# Patient Record
Sex: Male | Born: 1945 | Race: Black or African American | Hispanic: No | Smoking: Former smoker
Health system: Southern US, Community
[De-identification: ages and names within clinical notes are randomized; demographics above are authoritative.]

## PROBLEM LIST (undated history)

## (undated) DIAGNOSIS — E079 Disorder of thyroid, unspecified: Secondary | ICD-10-CM

## (undated) DIAGNOSIS — D649 Anemia, unspecified: Secondary | ICD-10-CM

## (undated) DIAGNOSIS — E039 Hypothyroidism, unspecified: Secondary | ICD-10-CM

## (undated) DIAGNOSIS — M1990 Unspecified osteoarthritis, unspecified site: Secondary | ICD-10-CM

## (undated) DIAGNOSIS — N189 Chronic kidney disease, unspecified: Secondary | ICD-10-CM

## (undated) DIAGNOSIS — R7303 Prediabetes: Secondary | ICD-10-CM

## (undated) DIAGNOSIS — I10 Essential (primary) hypertension: Secondary | ICD-10-CM

## (undated) HISTORY — DX: Unspecified osteoarthritis, unspecified site: M19.90

## (undated) HISTORY — DX: Anemia, unspecified: D64.9

## (undated) HISTORY — PX: KNEE SURGERY: SHX244

## (undated) HISTORY — DX: Hypothyroidism, unspecified: E03.9

## (undated) HISTORY — DX: Disorder of thyroid, unspecified: E07.9

## (undated) HISTORY — DX: Essential (primary) hypertension: I10

---

## 2020-11-09 DIAGNOSIS — C189 Malignant neoplasm of colon, unspecified: Secondary | ICD-10-CM

## 2020-11-09 HISTORY — PX: COLONOSCOPY: SHX174

## 2020-11-09 HISTORY — DX: Malignant neoplasm of colon, unspecified: C18.9

## 2020-12-03 ENCOUNTER — Other Ambulatory Visit (HOSPITAL_COMMUNITY): Payer: Self-pay | Admitting: Surgery

## 2020-12-03 ENCOUNTER — Other Ambulatory Visit: Payer: Self-pay | Admitting: Surgery

## 2020-12-03 DIAGNOSIS — C189 Malignant neoplasm of colon, unspecified: Secondary | ICD-10-CM

## 2020-12-04 ENCOUNTER — Telehealth: Payer: Self-pay | Admitting: Gastroenterology

## 2020-12-04 NOTE — Telephone Encounter (Signed)
Hi Dr. Rush Landmark., we have received an urgent referral from [facility] for advanced endoscopic maneuvers for polypectomy. Records will be sent to you for review. Please advise on scheduling. Thank you.

## 2020-12-04 NOTE — Telephone Encounter (Signed)
Thanks for letting me know.  Richard & Richard Silva, This is the patient that we had previously made the following discussion about:   Mansouraty, Telford Nab., MD  Ileana Roup, MD; Melanie Crazier, RN; Mansouraty, Telford Nab., MD CW,  I see that he has not had a MRN made as of yet.  If you can forward the referral I have put our team on this so that they can be on the look out for this patient.  Happy to be of assistance if I can, availability is likely going to be end of March or early April based on how overbooked I have been but we can see if something shows up earlier so that we can try and keep things moving if possible.  Richard and [name], as soon as you see this referral please work on getting the colonoscopy report from [place] and get him set up for a clinic visit with me because we may need to try to do a colon EMR next available to see if we can try and prevent him from needing a larger colon resection if possible.  Thanks for the referral.  GM         Previous Messages    ----- Message -----  From: Ileana Roup, MD  Sent: 12/02/2020   2:25 PM EST  To: [name]., MD  Subject: Urgent referral                                 [name] - there is a patient I was sending y'alls way... He is not in the [facility] system yet as he is from [place]. His name is Richard Silva [birth year] - 81yoM (healthy, currently still working as a [employer]ar[employer] manager!) 1 yr hx of IDA on iron therapy, last scope was 16 yrs ago. Ultimately underwent scope in [place] that showed polypoid lesion presumably in the distal ascending colon that was biopsied and adenomatous as well as multiple '2-8 mm' polyps in the 'ileocecal region.' Asc polyp was tattoo'd. Also found to have mass presumably in splenic flexure at 60-65 cm from the verge that returned adenoCA, tattoo'd. Staging CT A/P didn't clearly localize either finding - suggested some thickening in the mid  transverse colon. We have ordered a CT chest to complete the staging workup. Assuming negative, he could be looking at a subtotal colectomy if we aren't able to clear his polyps proximally including the ascending colon polyp. Would you be willing to take a look and see if candidate for an advanced type polypectomy? I appreciate your help!   Richard Silva   Go ahead and get him on the books for a Colon with EMR slot tentative date. I would get him into clinic and let me have an opportunity to talk with him about things.  I need the Colonoscopy report and pathology to help guide me, but hopefully will be in packet otherwise will need to get from [place], [place]. Please let Dr. Dema Severin and I know when he is scheduled for clinic (OK to [facility] or use a held slot if necessary). Thanks.  GM

## 2020-12-07 ENCOUNTER — Encounter: Payer: Self-pay | Admitting: Gastroenterology

## 2020-12-07 NOTE — Telephone Encounter (Signed)
Richard Silva can you set the is pt up for a clinic visit with Dr Richard Silva.  Also, can you tell the pt we need his colon report and path from [facility] sent for review.  Thank you see the message below from Dr Thurmon Fair and Richard Silva, as soon as you see this referral please work on getting the colonoscopy report from [facility] and get him set up for a clinic visit with me because we may need to try to do a colon EMR next available to see if we can try and prevent him from needing a larger colon resection if possible.  Thanks for the referral.  GM

## 2020-12-08 ENCOUNTER — Other Ambulatory Visit: Payer: Self-pay

## 2020-12-08 ENCOUNTER — Ambulatory Visit (HOSPITAL_COMMUNITY)
Admission: RE | Admit: 2020-12-08 | Discharge: 2020-12-08 | Disposition: A | Discharge status: Home / Self Care | Payer: Federal, State, Local not specified - PPO | Source: Ambulatory Visit | Attending: Surgery | Admitting: Surgery

## 2020-12-08 DIAGNOSIS — C189 Malignant neoplasm of colon, unspecified: Secondary | ICD-10-CM | POA: Diagnosis not present

## 2020-12-08 LAB — POCT I-STAT CREATININE: Creatinine, Ser: 1.1 mg/dL (ref 0.61–1.24)

## 2020-12-08 MED ORDER — IOHEXOL 300 MG/ML  SOLN
75.0000 mL | Freq: Once | INTRAMUSCULAR | Status: AC | PRN
  Administered 2020-12-08: 75 mL via INTRAVENOUS

## 2020-12-08 NOTE — Telephone Encounter (Signed)
Dr. Rush Landmark, Patient's appointment has been moved forward to 12/16/20@ 9:30am.

## 2020-12-09 NOTE — Telephone Encounter (Signed)
Thank you for update and getting him set up for sooner clinic visit. GM

## 2020-12-16 ENCOUNTER — Ambulatory Visit: Payer: Federal, State, Local not specified - PPO | Admitting: Gastroenterology

## 2020-12-16 ENCOUNTER — Other Ambulatory Visit (INDEPENDENT_AMBULATORY_CARE_PROVIDER_SITE_OTHER): Payer: Federal, State, Local not specified - PPO

## 2020-12-16 ENCOUNTER — Other Ambulatory Visit: Payer: Self-pay

## 2020-12-16 ENCOUNTER — Encounter: Payer: Self-pay | Admitting: Gastroenterology

## 2020-12-16 VITALS — BP 120/68 | HR 88 | Ht 66.0 in | Wt 219.0 lb

## 2020-12-16 DIAGNOSIS — C189 Malignant neoplasm of colon, unspecified: Secondary | ICD-10-CM

## 2020-12-16 DIAGNOSIS — D500 Iron deficiency anemia secondary to blood loss (chronic): Secondary | ICD-10-CM

## 2020-12-16 DIAGNOSIS — D122 Benign neoplasm of ascending colon: Secondary | ICD-10-CM

## 2020-12-16 DIAGNOSIS — C185 Malignant neoplasm of splenic flexure: Secondary | ICD-10-CM

## 2020-12-16 DIAGNOSIS — R933 Abnormal findings on diagnostic imaging of other parts of digestive tract: Secondary | ICD-10-CM

## 2020-12-16 DIAGNOSIS — Z86010 Personal history of colonic polyps: Secondary | ICD-10-CM | POA: Diagnosis not present

## 2020-12-16 LAB — BASIC METABOLIC PANEL
BUN: 12 mg/dL (ref 6–23)
CO2: 24 mEq/L (ref 19–32)
Calcium: 9.6 mg/dL (ref 8.4–10.5)
Chloride: 105 mEq/L (ref 96–112)
Creatinine, Ser: 1.14 mg/dL (ref 0.40–1.50)
GFR: 62.18 mL/min (ref >60.00)
Glucose, Bld: 116 mg/dL — ABNORMAL HIGH (ref 70–99)
Potassium: 4.1 mEq/L (ref 3.5–5.1)
Sodium: 135 mEq/L (ref 135–145)

## 2020-12-16 LAB — CBC
HCT: 28.2 % — ABNORMAL LOW (ref 39.0–52.0)
Hemoglobin: 9.3 g/dL — ABNORMAL LOW (ref 13.0–17.0)
MCHC: 32.7 g/dL (ref 30.0–36.0)
MCV: 81.4 fl (ref 78.0–100.0)
Platelets: 601 10*3/uL — ABNORMAL HIGH (ref 150.0–400.0)
RBC: 3.47 Mil/uL — ABNORMAL LOW (ref 4.22–5.81)
RDW: 17.7 % — ABNORMAL HIGH (ref 11.5–15.5)
WBC: 13.2 10*3/uL — ABNORMAL HIGH (ref 4.0–10.5)

## 2020-12-16 LAB — PROTIME-INR
INR: 1.1 ratio — ABNORMAL HIGH (ref 0.8–1.0)
Prothrombin Time: 12.5 s (ref 9.6–13.1)

## 2020-12-16 MED ORDER — PLENVU 140 G PO SOLR: 1.0000 | ORAL | 0 refills | Status: DC

## 2020-12-16 NOTE — Progress Notes (Signed)
[facility] 

## 2020-12-16 NOTE — Patient Instructions (Signed)
Your provider has requested that you go to the basement level for lab work before leaving today. Press "B" on the elevator. The lab is located at the first door on the left as you exit the elevator.  You have been scheduled for a colonoscopy. Please follow written instructions given to you at your visit today.  Please pick up your prep supplies at the pharmacy within the next 1-3 days. If you use inhalers (even only as needed), please bring them with you on the day of your procedure.   We have sent the following medications to your pharmacy for you to pick up at your convenience: Plenvu   START: Miralax -1 capful daily for 1 WEEK prior to colonoscopy.  Due to recent changes in healthcare laws, you may see the results of your imaging and laboratory studies on MyChart before your provider has had a chance to review them.  We understand that in some cases there may be results that are confusing or concerning to you. Not all laboratory results come back in the same time frame and the provider may be waiting for multiple results in order to interpret others.  Please give [place] 48 hours in order for your provider to thoroughly review all the results before contacting the office for clarification of your results.   If you are age 30 or older, your body mass index should be between 23-30. Your Body mass index is 35.35 kg/m. If this is out of the aforementioned range listed, please consider follow up with your Primary Care Provider.   Thank you for choosing me and [facility].  Dr. Rush Landmark

## 2020-12-17 LAB — IBC + FERRITIN
Ferritin: 28.3 ng/mL (ref 22.0–322.0)
Iron: 22 ug/dL — ABNORMAL LOW (ref 42–165)
Saturation Ratios: 6.5 % — ABNORMAL LOW (ref 20.0–50.0)
Transferrin: 241 mg/dL (ref 212.0–360.0)

## 2020-12-21 ENCOUNTER — Telehealth: Payer: Self-pay

## 2020-12-21 DIAGNOSIS — D122 Benign neoplasm of ascending colon: Secondary | ICD-10-CM

## 2020-12-21 DIAGNOSIS — D509 Iron deficiency anemia, unspecified: Secondary | ICD-10-CM

## 2020-12-21 DIAGNOSIS — C189 Malignant neoplasm of colon, unspecified: Secondary | ICD-10-CM

## 2020-12-21 DIAGNOSIS — R933 Abnormal findings on diagnostic imaging of other parts of digestive tract: Secondary | ICD-10-CM

## 2020-12-21 NOTE — Telephone Encounter (Signed)
-----   Message from Richard Silva., MD sent at 12/18/2020  6:14 AM EST ----- To twice dailyRovonda, Please let the patien know that his iron deficiency persists.  He needs to increase his oral iron to twice daily.  Also we will go ahead and add on a quick EGD at the time of his colonoscopy due to the iron deficiency.  He does not need any additional time added for his procedure I just need to get the approval for it which should not be a problem because this is iron deficiency anemia.  Thanks. GM

## 2020-12-21 NOTE — Progress Notes (Signed)
[place] VISIT   Primary Care Provider Vasireddy, Lanetta [place], [place] [address] [phone]  Referring Provider Ileana Roup, MD [address] [place],  [place] [address] [phone]  Patient Profile: Richard Silva is a 75 y.o. male with a pmh significant for hypertension, hypothyroidism, chronic anemia, arthritis, family history colon cancer (mother diagnosed in 9s), recent colon cancer diagnosis (splenic flexure), colon polyps (not removed).  The patient presents to the [facility] for an evaluation and management of problem(s) noted below:  Problem List 1. Adenomatous polyp of ascending colon   2. 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 History of colonic polyps   4. Abnormal colonoscopy   5. Iron deficiency anemia due to chronic blood loss     History of Present Illness This is a patient who recently underwent a colonoscopy (January 2022) by Dr. Earley Brooke in [place] for evaluation of iron deficiency anemia that had been longer standing (for over a year).  He was found to have a splenic flexure adenocarcinoma as well as multiple polyps in his right colon including one polypoid mass that returned as adenomatous tissue.  The patient was evaluated by Dr. Dema Severin colorectal surgery and is considering surgical resection.  The patient would need an extended right hemicolectomy if resection of the ascending colon polypoid mass which returned just as adenoma is unable to be resected.  It is for this reason that the patient is referred to consider advanced resection of the right-sided colon lesions in effort of trying to minimize right hemicolectomy and just undergo segmental colectomy.  The patient is accompanied by his family today.  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  The patient has not noted overt blood in his stools although with the most recent  preparation there was darkness noted in the final prep before undergoing colonoscopy and that was concerning to the patient and family about potentially having blood loss anemia.  The patient would like an attempt at resection if possible through colonoscopy but understands that this may not be possible and is willing to undergo a right hemicolectomy extension if necessary.  He has a family history in his mother of colon cancer diagnosed in her 81s.  He has a bowel movement on a daily to every other day basis and that has been longer standing over the course of the last year.  He denies GERD symptoms or dyspepsia.  Patient has never had a prior upper endoscopy.    GI Review of Systems Positive as above Negative for dysphagia, odynophagia, nausea, vomiting, pain  Review of Systems General: Positive for minor weight loss unintentionally; denies fevers/chills HEENT: Denies oral lesions Cardiovascular: Denies chest pain/palpitations Pulmonary: Denies shortness of breath/cough Gastroenterological: See HPI Genitourinary: Denies darkened urine or hematuria Hematological: Denies easy bruising/bleeding Dermatological: Denies jaundice Psychological: Mood is stable   Medications Current Outpatient Medications  Medication Sig Dispense Refill  . amLODipine (NORVASC) 10 MG tablet Take 10 mg by mouth daily.    Marland Kitchen atorvastatin (LIPITOR) 40 MG tablet Take 40 mg by mouth daily.    . clobetasol cream (TEMOVATE) 4.40 % Apply 1 application topically 2 (two) times daily.    . COPPER PO Take 3 mg by mouth daily.    Marland Kitchen doxycycline (DORYX) 100 MG EC tablet Take 100 mg by mouth 2 (two) times daily.    . ferrous sulfate 325 (65 FE) MG tablet Take 325  mg by mouth daily with breakfast.    . gabapentin (NEURONTIN) 600 MG tablet Take 600 mg by mouth 2 (two) times daily. 2 tablets at bedtime    . levothyroxine (SYNTHROID) 50 MCG tablet Take 50 mcg by mouth daily before breakfast.    . PEG-KCl-NaCl-NaSulf-Na Asc-C  (PLENVU) 140 g SOLR Take 1 kit by mouth as directed. Use coupon: BIN: [id number] PNC: CNRX Group: [id number] ID: [id number] 1 each 0  . spironolactone (ALDACTONE) 25 MG tablet Take 25 mg by mouth daily.    Marland Kitchen triamcinolone (KENALOG) 0.1 % Apply 1 application topically 2 (two) times daily.    . valsartan-hydrochlorothiazide (DIOVAN-HCT) 160-25 MG tablet Take 1 tablet by mouth daily.     No current facility-administered medications for this visit.    Allergies No Known Allergies  Histories Past Medical History:  Diagnosis Date  . Anemia   . Arthritis   . Colon cancer ([place]) 11/09/2020  . Hypertension   . Hypothyroidism   . Thyroid disease    Past Surgical History:  Procedure Laterality Date  . COLONOSCOPY  11/09/2020  . KNEE SURGERY     Social History   Socioeconomic History  . Marital status: Widowed    Spouse name: Not on file  . Number of children: Not on file  . Years of education: Not on file  . Highest education level: Not on file  Occupational History  . Not on file  Tobacco Use  . Smoking status: Never Smoker  . Smokeless tobacco: Not on file  Substance and Sexual Activity  . Alcohol use: Yes    Comment: occasional  . Drug use: Never  . Sexual activity: Not Currently  Other Topics Concern  . Not on file  Social History Narrative  . Not on file   Social Determinants of Health   Financial Resource Strain: Not on file  Food Insecurity: Not on file  Transportation Needs: Not on file  Physical Activity: Not on file  Stress: Not on file  Social Connections: Not on file  Intimate Partner Violence: Not on file   Family History  Problem Relation Age of Onset  . Colon cancer Mother        52's  . Diabetes Brother   . Esophageal cancer Neg Hx   . Inflammatory bowel disease Neg Hx   . Liver disease Neg Hx   . Pancreatic cancer Neg Hx   . Stomach cancer Neg Hx    I have reviewed his medical, social, and family history in detail and updated the electronic  medical record as necessary.    PHYSICAL EXAMINATION  BP 120/68   Pulse 88   Ht _0  (1.676 m)   Wt 219 lb (99.3 kg)   BMI 35.35 kg/m  Wt Readings from Last 3 Encounters:  12/16/20 219 lb (99.3 kg)  GEN: NAD, appears stated age, doesn't appear chronically ill, accompanied by daughter PSYCH: Cooperative, without pressured speech EYE: Conjunctivae pink, sclerae anicteric ENT: MMM CV: Nontachycardic RESP: No audible wheezing GI: NABS, soft, protuberant abdomen, ventral diastases present, NT, without rebound or guarding MSK/EXT: No lower extremity edema SKIN: No jaundice NEURO:  Alert & Oriented x 3, no focal deficits   REVIEW OF DATA  I reviewed the following data at the time of this encounter:  GI Procedures and Studies  January 2022 colonoscopy [facility]) A single sessile nonbleeding polyp of benign appearance ranging in size from 6 to 7 mm with a polyp in the sigmoid colon this is  between 25 and 30 cm from the anus.  A single piece polypectomy was performed using a cold snare. There are multiple polyps around the ileocecal region ranging from 2 to 8 mm in size.  A polypoid mass was seen in the distal ascending colon which was biopsied and the site tattooed with [place] ink.  Multiple biopsies were performed. A malignant looking mass, partially obstructing was seen at 60 to 65 cm.  Multiple cold forceps biopsies were performed for histology.  2 mL Niger ink injection was successfully applied for tattooing. Pathology Ascending colon TVA Colorectal adenocarcinoma splenic flexure mass Adenomatous polyp sigmoid colon  Laboratory Studies  Reviewed those in epic and care everywhere  Imaging Studies  February 2022 CT abdomen pelvis (outside report) mild nonspecific wall thickening along the mid transverse colon could be secondary to under distention or mass.  Correlate with colonoscopy.  4 mm left lower lobe pulmonary nodules.  Patient is at increased risk for lung cancer, consider  follow-up chest CT in 1 year   ASSESSMENT  Mr. Osowski is a 75 y.o. male with a pmh significant for hypertension, hypothyroidism, chronic anemia, arthritis, family history colon cancer (mother), recent colon cancer diagnosis (splenic flexure), colon polyps (not removed).  The patient is seen today for evaluation and management of:  1. Adenomatous polyp of ascending colon   2. 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 History of colonic polyps   4. Abnormal colonoscopy   5. Iron deficiency anemia due to chronic blood loss    The patient is hemodynamically stable.  Clinically the patient is stable as well however, he has a new diagnosis of colon cancer at the presumed splenic flexure versus transverse colon (based on imaging) with evidence of reported iron deficiency anemia.  The patient has already been evaluated by colorectal surgery with plan for surgical intervention as next steps in his treatment algorithm.  However, due to the reported area of the cancer as well as previous polyps that were noted but not removed in the ileocecum and ascending colon regions the potential intervention of an extended right hemicolectomy versus segmental colectomy if advanced resection can be performed of the right-sided lesions has been entertained.  The ascending colon lesion is noted to be a tubulovillous adenoma based on biopsies per the colonoscopy report.  It is reasonable to consider trying to prevent the patient from needing extended right hemicolectomy if resection of the right-sided colon lesions is possible.  Based upon the description (as the endoscopic pictures were black-and-white), I do feel that it is reasonable to pursue an Advanced Polypectomy attempt of the polyp/lesion.  We discussed some of the techniques of advanced polypectomy which include Endoscopic Mucosal Resection, OVESCO Full-Thickness Resection, Endorotor Morcellation, and Tissue Ablation via Fulguration.  We also reviewed images of  typical techniques as noted above.  The risks and benefits of endoscopic evaluation were discussed with the patient; these include but are not limited to the risk of perforation, infection, bleeding, missed lesions, lack of diagnosis, severe illness requiring hospitalization, as well as anesthesia and sedation related illnesses.  During attempts at advanced resection, the risks of bleeding and perforation/leak are increased as opposed to diagnostic and screening procedures, and that was discussed with the patient as well.   In addition, I explained that with the possible need for piecemeal resection, subsequent short-interval endoscopic evaluation for follow up and potential retreatment of the lesion/area may be necessary.  If, after attempt at removal of the polyp/lesion, it is  found that the patient has a complication or that an invasive lesion or malignant lesion is found, or that the polyp/lesion continues to recur, the patient is aware and understands that surgery may still be indicated/required (meaning extended right hemicolectomy).  As the patient has evidence of iron deficiency anemia even though he likely has this is a result of chronic blood loss anemia in the colon, will perform an upper endoscopy for completion of his work-up.  All patient questions were answered, to the best of my ability, and the patient agrees to the aforementioned plan of action with follow-up as indicated.   PLAN  Preprocedure labs to be obtained Iron indices to be obtained and if true iron deficiency persists then oral iron therapy will need to be initiated EGD to be performed for diagnostic purposes and evaluation of iron deficiency Proceed with scheduling colonoscopy with attempt at advanced polyp resection of the right-sided colon lesions to try to prevent extended right hemicolectomy if possible Begin MiraLAX 1 capful daily prior to colonoscopy Follow-up with Dr. Dema Severin of colorectal surgery thereafter   Orders  Placed This Encounter  Procedures  . Procedural/ Surgical Case Request: COLONOSCOPY WITH PROPOFOL, ENDOSCOPIC MUCOSAL RESECTION  . CBC  . Basic Metabolic Panel (BMET)  . INR/PT  . IBC + Ferritin  . Ambulatory referral to Gastroenterology    New Prescriptions   PEG-KCL-NACL-NASULF-NA ASC-C (PLENVU) 140 G SOLR    Take 1 kit by mouth as directed. Use coupon: BIN: [id number] PNC: CNRX Group: [id number] ID: [id number]   Modified Medications   No medications on file    Planned Follow Up No follow-ups on file.   Total Time in Face-to-Face and in Coordination of Care for patient including independent/personal interpretation/review of prior testing, medical history, examination, medication adjustment, communicating results with the patient directly, and documentation with the EHR is 45 minutes.   Justice Britain, MD [facility] Advanced Endoscopy Office # [phone]

## 2020-12-21 NOTE — Telephone Encounter (Signed)
Left voice mail for pt, asking for return call. Pt will need to increase oral iron to BID. EGD has been added to procedures on 01/11/21 @ [facility]. I have also sent Amy [facility] new amb ref with EGD added.

## 2020-12-21 NOTE — H&P (View-Only) (Signed)
 GASTROENTEROLOGY OUTPATIENT CLINIC VISIT   Primary Care Provider Vasireddy, Venugopal Kiran, MD [address] [place] [address] [phone]  Referring Provider White, Christopher M, MD [address] [place],  [place] [address] [phone]  Patient Profile: Richard Silva is a 75 y.o. male with a pmh significant for hypertension, hypothyroidism, chronic anemia, arthritis, family history colon cancer (mother diagnosed in 50s), recent colon cancer diagnosis (splenic flexure), colon polyps (not removed).  The patient presents to the [facility] for an evaluation and management of problem(s) noted below:  Problem List 1. Adenomatous polyp of ascending colon   2. Malignant neoplasm of splenic flexure (HCC)   3. History of colonic polyps   4. Abnormal colonoscopy   5. Iron deficiency anemia due to chronic blood loss     History of Present Illness This is a patient who recently underwent a colonoscopy (January 2022) by Dr. Pandya in [place] for evaluation of iron deficiency anemia that had been longer standing (for over a year).  He was found to have a splenic flexure adenocarcinoma as well as multiple polyps in his right colon including one polypoid mass that returned as adenomatous tissue.  The patient was evaluated by Dr. White colorectal surgery and is considering surgical resection.  The patient would need an extended right hemicolectomy if resection of the ascending colon polypoid mass which returned just as adenoma is unable to be resected.  It is for this reason that the patient is referred to consider advanced resection of the right-sided colon lesions in effort of trying to minimize right hemicolectomy and just undergo segmental colectomy.  The patient is accompanied by his family today.  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  The patient has not noted overt blood in his stools although with the most recent  preparation there was darkness noted in the final prep before undergoing colonoscopy and that was concerning to the patient and family about potentially having blood loss anemia.  The patient would like an attempt at resection if possible through colonoscopy but understands that this may not be possible and is willing to undergo a right hemicolectomy extension if necessary.  He has a family history in his mother of colon cancer diagnosed in her 50s.  He has a bowel movement on a daily to every other day basis and that has been longer standing over the course of the last year.  He denies GERD symptoms or dyspepsia.  Patient has never had a prior upper endoscopy.    GI Review of Systems Positive as above Negative for dysphagia, odynophagia, nausea, vomiting, pain  Review of Systems General: Positive for minor weight loss unintentionally; denies fevers/chills HEENT: Denies oral lesions Cardiovascular: Denies chest pain/palpitations Pulmonary: Denies shortness of breath/cough Gastroenterological: See HPI Genitourinary: Denies darkened urine or hematuria Hematological: Denies easy bruising/bleeding Dermatological: Denies jaundice Psychological: Mood is stable   Medications Current Outpatient Medications  Medication Sig Dispense Refill  . amLODipine (NORVASC) 10 MG tablet Take 10 mg by mouth daily.    . atorvastatin (LIPITOR) 40 MG tablet Take 40 mg by mouth daily.    . clobetasol cream (TEMOVATE) 0.05 % Apply 1 application topically 2 (two) times daily.    . COPPER PO Take 3 mg by mouth daily.    . doxycycline (DORYX) 100 MG EC tablet Take 100 mg by mouth 2 (two) times daily.    . ferrous sulfate 325 (65 FE) MG tablet Take 325   mg by mouth daily with breakfast.    . gabapentin (NEURONTIN) 600 MG tablet Take 600 mg by mouth 2 (two) times daily. 2 tablets at bedtime    . levothyroxine (SYNTHROID) 50 MCG tablet Take 50 mcg by mouth daily before breakfast.    . PEG-KCl-NaCl-NaSulf-Na Asc-C  (PLENVU) 140 g SOLR Take 1 kit by mouth as directed. Use coupon: BIN: [id number] PNC: CNRX Group: [id number] ID: [id number] 1 each 0  . spironolactone (ALDACTONE) 25 MG tablet Take 25 mg by mouth daily.    . triamcinolone (KENALOG) 0.1 % Apply 1 application topically 2 (two) times daily.    . valsartan-hydrochlorothiazide (DIOVAN-HCT) 160-25 MG tablet Take 1 tablet by mouth daily.     No current facility-administered medications for this visit.    Allergies No Known Allergies  Histories Past Medical History:  Diagnosis Date  . Anemia   . Arthritis   . Colon cancer (HCC) 11/09/2020  . Hypertension   . Hypothyroidism   . Thyroid disease    Past Surgical History:  Procedure Laterality Date  . COLONOSCOPY  11/09/2020  . KNEE SURGERY     Social History   Socioeconomic History  . Marital status: Widowed    Spouse name: Not on file  . Number of children: Not on file  . Years of education: Not on file  . Highest education level: Not on file  Occupational History  . Not on file  Tobacco Use  . Smoking status: Never Smoker  . Smokeless tobacco: Not on file  Substance and Sexual Activity  . Alcohol use: Yes    Comment: occasional  . Drug use: Never  . Sexual activity: Not Currently  Other Topics Concern  . Not on file  Social History Narrative  . Not on file   Social Determinants of Health   Financial Resource Strain: Not on file  Food Insecurity: Not on file  Transportation Needs: Not on file  Physical Activity: Not on file  Stress: Not on file  Social Connections: Not on file  Intimate Partner Violence: Not on file   Family History  Problem Relation Age of Onset  . Colon cancer Mother        50's  . Diabetes Brother   . Esophageal cancer Neg Hx   . Inflammatory bowel disease Neg Hx   . Liver disease Neg Hx   . Pancreatic cancer Neg Hx   . Stomach cancer Neg Hx    I have reviewed his medical, social, and family history in detail and updated the electronic  medical record as necessary.    PHYSICAL EXAMINATION  BP 120/68   Pulse 88   Ht 5' 6" (1.676 m)   Wt 219 lb (99.3 kg)   BMI 35.35 kg/m  Wt Readings from Last 3 Encounters:  12/16/20 219 lb (99.3 kg)  GEN: NAD, appears stated age, doesn't appear chronically ill, accompanied by daughter PSYCH: Cooperative, without pressured speech EYE: Conjunctivae pink, sclerae anicteric ENT: MMM CV: Nontachycardic RESP: No audible wheezing GI: NABS, soft, protuberant abdomen, ventral diastases present, NT, without rebound or guarding MSK/EXT: No lower extremity edema SKIN: No jaundice NEURO:  Alert & Oriented x 3, no focal deficits   REVIEW OF DATA  I reviewed the following data at the time of this encounter:  GI Procedures and Studies  January 2022 colonoscopy ([place]) A single sessile nonbleeding polyp of benign appearance ranging in size from 6 to 7 mm with a polyp in the sigmoid colon this is   between 25 and 30 cm from the anus.  A single piece polypectomy was performed using a cold snare. There are multiple polyps around the ileocecal region ranging from 2 to 8 mm in size.  A polypoid mass was seen in the distal ascending colon which was biopsied and the site tattooed with India ink.  Multiple biopsies were performed. A malignant looking mass, partially obstructing was seen at 60 to 65 cm.  Multiple cold forceps biopsies were performed for histology.  2 mL India ink injection was successfully applied for tattooing. Pathology Ascending colon TVA Colorectal adenocarcinoma splenic flexure mass Adenomatous polyp sigmoid colon  Laboratory Studies  Reviewed those in epic and care everywhere  Imaging Studies  February 2022 CT abdomen pelvis (outside report) mild nonspecific wall thickening along the mid transverse colon could be secondary to under distention or mass.  Correlate with colonoscopy.  4 mm left lower lobe pulmonary nodules.  Patient is at increased risk for lung cancer, consider  follow-up chest CT in 1 year   ASSESSMENT  [name] is a 75 y.o. male with a pmh significant for hypertension, hypothyroidism, chronic anemia, arthritis, family history colon cancer (mother), recent colon cancer diagnosis (splenic flexure), colon polyps (not removed).  The patient is seen today for evaluation and management of:  1. Adenomatous polyp of ascending colon   2. Malignant neoplasm of splenic flexure (HCC)   3. History of colonic polyps   4. Abnormal colonoscopy   5. Iron deficiency anemia due to chronic blood loss    The patient is hemodynamically stable.  Clinically the patient is stable as well however, he has a new diagnosis of colon cancer at the presumed splenic flexure versus transverse colon (based on imaging) with evidence of reported iron deficiency anemia.  The patient has already been evaluated by colorectal surgery with plan for surgical intervention as next steps in his treatment algorithm.  However, due to the reported area of the cancer as well as previous polyps that were noted but not removed in the ileocecum and ascending colon regions the potential intervention of an extended right hemicolectomy versus segmental colectomy if advanced resection can be performed of the right-sided lesions has been entertained.  The ascending colon lesion is noted to be a tubulovillous adenoma based on biopsies per the colonoscopy report.  It is reasonable to consider trying to prevent the patient from needing extended right hemicolectomy if resection of the right-sided colon lesions is possible.  Based upon the description (as the endoscopic pictures were black-and-white), I do feel that it is reasonable to pursue an Advanced Polypectomy attempt of the polyp/lesion.  We discussed some of the techniques of advanced polypectomy which include Endoscopic Mucosal Resection, OVESCO Full-Thickness Resection, Endorotor Morcellation, and Tissue Ablation via Fulguration.  We also reviewed images of  typical techniques as noted above.  The risks and benefits of endoscopic evaluation were discussed with the patient; these include but are not limited to the risk of perforation, infection, bleeding, missed lesions, lack of diagnosis, severe illness requiring hospitalization, as well as anesthesia and sedation related illnesses.  During attempts at advanced resection, the risks of bleeding and perforation/leak are increased as opposed to diagnostic and screening procedures, and that was discussed with the patient as well.   In addition, I explained that with the possible need for piecemeal resection, subsequent short-interval endoscopic evaluation for follow up and potential retreatment of the lesion/area may be necessary.  If, after attempt at removal of the polyp/lesion, it is   found that the patient has a complication or that an invasive lesion or malignant lesion is found, or that the polyp/lesion continues to recur, the patient is aware and understands that surgery may still be indicated/required (meaning extended right hemicolectomy).  As the patient has evidence of iron deficiency anemia even though he likely has this is a result of chronic blood loss anemia in the colon, will perform an upper endoscopy for completion of his work-up.  All patient questions were answered, to the best of my ability, and the patient agrees to the aforementioned plan of action with follow-up as indicated.   PLAN  Preprocedure labs to be obtained Iron indices to be obtained and if true iron deficiency persists then oral iron therapy will need to be initiated EGD to be performed for diagnostic purposes and evaluation of iron deficiency Proceed with scheduling colonoscopy with attempt at advanced polyp resection of the right-sided colon lesions to try to prevent extended right hemicolectomy if possible Begin MiraLAX 1 capful daily prior to colonoscopy Follow-up with Dr. White of colorectal surgery thereafter   Orders  Placed This Encounter  Procedures  . Procedural/ Surgical Case Request: COLONOSCOPY WITH PROPOFOL, ENDOSCOPIC MUCOSAL RESECTION  . CBC  . Basic Metabolic Panel (BMET)  . INR/PT  . IBC + Ferritin  . Ambulatory referral to Gastroenterology    New Prescriptions   PEG-KCL-NACL-NASULF-NA ASC-C (PLENVU) 140 G SOLR    Take 1 kit by mouth as directed. Use coupon: BIN: [id number] PNC: CNRX Group: [id number] ID: [id number]   Modified Medications   No medications on file    Planned Follow Up No follow-ups on file.   Total Time in Face-to-Face and in Coordination of Care for patient including independent/personal interpretation/review of prior testing, medical history, examination, medication adjustment, communicating results with the patient directly, and documentation with the EHR is 45 minutes.   Minetta Krisher Mansouraty, MD [facility] Advanced Endoscopy Office # [phone]  

## 2020-12-22 ENCOUNTER — Encounter: Payer: Self-pay | Admitting: Gastroenterology

## 2020-12-22 DIAGNOSIS — Z86010 Personal history of colonic polyps: Secondary | ICD-10-CM | POA: Insufficient documentation

## 2020-12-22 DIAGNOSIS — R933 Abnormal findings on diagnostic imaging of other parts of digestive tract: Secondary | ICD-10-CM | POA: Insufficient documentation

## 2020-12-22 DIAGNOSIS — C185 Malignant neoplasm of splenic flexure: Secondary | ICD-10-CM | POA: Insufficient documentation

## 2020-12-22 DIAGNOSIS — D500 Iron deficiency anemia secondary to blood loss (chronic): Secondary | ICD-10-CM | POA: Insufficient documentation

## 2020-12-22 DIAGNOSIS — D122 Benign neoplasm of ascending colon: Secondary | ICD-10-CM | POA: Insufficient documentation

## 2020-12-29 NOTE — Telephone Encounter (Signed)
Pt called to inform me that he did rec my message and did increase oral iron to BID. Pt voiced understanding that he will be having both Endo and Colon.

## 2021-01-04 ENCOUNTER — Other Ambulatory Visit: Payer: Self-pay

## 2021-01-04 ENCOUNTER — Encounter (HOSPITAL_COMMUNITY): Payer: Self-pay | Admitting: Gastroenterology

## 2021-01-07 ENCOUNTER — Other Ambulatory Visit (HOSPITAL_COMMUNITY)
Admission: RE | Admit: 2021-01-07 | Discharge: 2021-01-07 | Disposition: A | Discharge status: Home / Self Care | Payer: Federal, State, Local not specified - PPO | Source: Ambulatory Visit | Attending: Gastroenterology | Admitting: Gastroenterology

## 2021-01-07 DIAGNOSIS — Z01812 Encounter for preprocedural laboratory examination: Secondary | ICD-10-CM | POA: Diagnosis not present

## 2021-01-07 DIAGNOSIS — Z20822 Contact with and (suspected) exposure to covid-19: Secondary | ICD-10-CM | POA: Diagnosis not present

## 2021-01-07 LAB — SARS CORONAVIRUS 2 (TAT 6-24 HRS): SARS Coronavirus 2: NEGATIVE

## 2021-01-10 NOTE — Anesthesia Preprocedure Evaluation (Addendum)
Anesthesia Evaluation  Patient identified by MRN, date of birth, ID band Patient awake    Reviewed: Allergy & Precautions, Patient's Chart, lab work & pertinent test results  Airway Mallampati: II  TM Distance: >3 FB     Dental   Pulmonary former smoker,    breath sounds clear to auscultation       Cardiovascular hypertension,  Rhythm:Regular Rate:Normal     Neuro/Psych negative psych ROS   GI/Hepatic negative GI ROS, Neg liver ROS,   Endo/Other  Hypothyroidism   Renal/GU      Musculoskeletal  (+) Arthritis ,   Abdominal (+) + obese,   Peds  Hematology  (+) anemia , Hgb 9.3   Anesthesia Other Findings   Reproductive/Obstetrics                            Anesthesia Physical Anesthesia Plan  ASA: III  Anesthesia Plan: MAC   Post-op Pain Management:    Induction: Intravenous  PONV Risk Score and Plan: Treatment may vary due to age or medical condition  Airway Management Planned: Natural Airway and Nasal Cannula  Additional Equipment:   Intra-op Plan:   Post-operative Plan:   Informed Consent: I have reviewed the patients History and Physical, chart, labs and discussed the procedure including the risks, benefits and alternatives for the proposed anesthesia with the patient or authorized representative who has indicated his/her understanding and acceptance.     Dental advisory given  Plan Discussed with: CRNA and Anesthesiologist  Anesthesia Plan Comments: (Hx of colon CA w polyps and dyspepsia for EGD/ Colon)       Anesthesia Quick Evaluation

## 2021-01-11 ENCOUNTER — Ambulatory Visit (HOSPITAL_COMMUNITY): Payer: Federal, State, Local not specified - PPO | Admitting: Anesthesiology

## 2021-01-11 ENCOUNTER — Other Ambulatory Visit: Payer: Self-pay

## 2021-01-11 ENCOUNTER — Ambulatory Visit (HOSPITAL_COMMUNITY)
Admission: RE | Admit: 2021-01-11 | Discharge: 2021-01-11 | Disposition: A | Discharge status: Home / Self Care | Payer: Federal, State, Local not specified - PPO | Attending: Gastroenterology | Admitting: Gastroenterology

## 2021-01-11 ENCOUNTER — Encounter (HOSPITAL_COMMUNITY): Payer: Self-pay | Admitting: Gastroenterology

## 2021-01-11 ENCOUNTER — Encounter (HOSPITAL_COMMUNITY)
Admission: RE | Disposition: A | Discharge status: Home / Self Care | Payer: Self-pay | Source: Home / Self Care | Attending: Gastroenterology

## 2021-01-11 DIAGNOSIS — Z800 Family history of malignant neoplasm of digestive organs: Secondary | ICD-10-CM | POA: Insufficient documentation

## 2021-01-11 DIAGNOSIS — Z87891 Personal history of nicotine dependence: Secondary | ICD-10-CM | POA: Diagnosis not present

## 2021-01-11 DIAGNOSIS — C184 Malignant neoplasm of transverse colon: Secondary | ICD-10-CM

## 2021-01-11 DIAGNOSIS — C185 Malignant neoplasm of splenic flexure: Secondary | ICD-10-CM | POA: Diagnosis present

## 2021-01-11 DIAGNOSIS — K635 Polyp of colon: Secondary | ICD-10-CM

## 2021-01-11 DIAGNOSIS — B9681 Helicobacter pylori [H. pylori] as the cause of diseases classified elsewhere: Secondary | ICD-10-CM | POA: Diagnosis not present

## 2021-01-11 DIAGNOSIS — K5730 Diverticulosis of large intestine without perforation or abscess without bleeding: Secondary | ICD-10-CM | POA: Insufficient documentation

## 2021-01-11 DIAGNOSIS — Z79890 Hormone replacement therapy (postmenopausal): Secondary | ICD-10-CM | POA: Diagnosis not present

## 2021-01-11 DIAGNOSIS — D500 Iron deficiency anemia secondary to blood loss (chronic): Secondary | ICD-10-CM | POA: Diagnosis not present

## 2021-01-11 DIAGNOSIS — K2970 Gastritis, unspecified, without bleeding: Secondary | ICD-10-CM

## 2021-01-11 DIAGNOSIS — K5669 Other partial intestinal obstruction: Secondary | ICD-10-CM | POA: Insufficient documentation

## 2021-01-11 DIAGNOSIS — K641 Second degree hemorrhoids: Secondary | ICD-10-CM | POA: Diagnosis not present

## 2021-01-11 DIAGNOSIS — K644 Residual hemorrhoidal skin tags: Secondary | ICD-10-CM | POA: Diagnosis not present

## 2021-01-11 DIAGNOSIS — C182 Malignant neoplasm of ascending colon: Secondary | ICD-10-CM | POA: Insufficient documentation

## 2021-01-11 DIAGNOSIS — D122 Benign neoplasm of ascending colon: Secondary | ICD-10-CM | POA: Insufficient documentation

## 2021-01-11 DIAGNOSIS — Z79899 Other long term (current) drug therapy: Secondary | ICD-10-CM | POA: Diagnosis not present

## 2021-01-11 DIAGNOSIS — K2950 Unspecified chronic gastritis without bleeding: Secondary | ICD-10-CM | POA: Diagnosis not present

## 2021-01-11 DIAGNOSIS — R933 Abnormal findings on diagnostic imaging of other parts of digestive tract: Secondary | ICD-10-CM

## 2021-01-11 HISTORY — PX: ENDOSCOPIC MUCOSAL RESECTION: SHX6839

## 2021-01-11 HISTORY — PX: SUBMUCOSAL TATTOO INJECTION: SHX6856

## 2021-01-11 HISTORY — PX: BIOPSY: SHX5522

## 2021-01-11 HISTORY — PX: HEMOSTASIS CLIP PLACEMENT: SHX6857

## 2021-01-11 HISTORY — PX: POLYPECTOMY: SHX5525

## 2021-01-11 HISTORY — PX: ESOPHAGOGASTRODUODENOSCOPY (EGD) WITH PROPOFOL: SHX5813

## 2021-01-11 HISTORY — PX: COLONOSCOPY WITH PROPOFOL: SHX5780

## 2021-01-11 SURGERY — COLONOSCOPY WITH PROPOFOL
Anesthesia: Monitor Anesthesia Care

## 2021-01-11 MED ORDER — LACTATED RINGERS IV SOLN: INTRAVENOUS | Status: DC

## 2021-01-11 MED ORDER — PROPOFOL 1000 MG/100ML IV EMUL
INTRAVENOUS | Status: AC
  Filled 2021-01-11: qty 100

## 2021-01-11 MED ORDER — LIDOCAINE 2% (20 MG/ML) 5 ML SYRINGE
INTRAMUSCULAR | Status: DC | PRN
  Administered 2021-01-11: 80 mg via INTRAVENOUS

## 2021-01-11 MED ORDER — PROPOFOL 10 MG/ML IV BOLUS
INTRAVENOUS | Status: DC | PRN
  Administered 2021-01-11 (×2): 20 mg via INTRAVENOUS
  Administered 2021-01-11: 30 mg via INTRAVENOUS
  Administered 2021-01-11 (×2): 20 mg via INTRAVENOUS

## 2021-01-11 MED ORDER — SPOT INK MARKER SYRINGE KIT
PACK | SUBMUCOSAL | Status: DC | PRN
  Administered 2021-01-11 (×2): 1.5 mL via SUBMUCOSAL

## 2021-01-11 MED ORDER — PROPOFOL 500 MG/50ML IV EMUL
INTRAVENOUS | Status: AC
  Filled 2021-01-11: qty 50

## 2021-01-11 MED ORDER — PROPOFOL 500 MG/50ML IV EMUL
INTRAVENOUS | Status: DC | PRN
  Administered 2021-01-11: 125 ug/kg/min via INTRAVENOUS

## 2021-01-11 MED ORDER — SODIUM CHLORIDE 0.9 % IV SOLN: INTRAVENOUS | Status: DC

## 2021-01-11 MED ORDER — PROPOFOL 500 MG/50ML IV EMUL
INTRAVENOUS | Status: AC
  Filled 2021-01-11: qty 100

## 2021-01-11 MED ORDER — SPOT INK MARKER SYRINGE KIT
PACK | SUBMUCOSAL | Status: AC
  Filled 2021-01-11: qty 5

## 2021-01-11 SURGICAL SUPPLY — 24 items

## 2021-01-11 NOTE — Discharge Instructions (Signed)
YOU HAD AN ENDOSCOPIC PROCEDURE TODAY: Refer to the procedure report and other information in the discharge instructions given to you for any specific questions about what was found during the examination. If this information does not answer your questions, please call [place] office at [phone] to clarify.  ° °YOU SHOULD EXPECT: Some feelings of bloating in the abdomen. Passage of more gas than usual. Walking can help get rid of the air that was put into your GI tract during the procedure and reduce the bloating. If you had a lower endoscopy (such as a colonoscopy or flexible sigmoidoscopy) you may notice spotting of blood in your stool or on the toilet paper. Some abdominal soreness may be present for a day or two, also. ° °DIET: Your first meal following the procedure should be a light meal and then it is ok to progress to your normal diet. A half-sandwich or bowl of soup is an example of a good first meal. Heavy or fried foods are harder to digest and may make you feel nauseous or bloated. Drink plenty of fluids but you should avoid alcoholic beverages for 24 hours. If you had a esophageal dilation, please see attached instructions for diet.   ° °ACTIVITY: Your care partner should take you home directly after the procedure. You should plan to take it easy, moving slowly for the rest of the day. You can resume normal activity the day after the procedure however YOU SHOULD NOT DRIVE, use power tools, machinery or perform tasks that involve climbing or major physical exertion for 24 hours (because of the sedation medicines used during the test).  ° °SYMPTOMS TO REPORT IMMEDIATELY: °A gastroenterologist can be reached at any hour. Please call [phone]  for any of the following symptoms:  °Following lower endoscopy (colonoscopy, flexible sigmoidoscopy) °Excessive amounts of blood in the stool  °Significant tenderness, worsening of abdominal pains  °Swelling of the abdomen that is new, acute  °Fever of 100° or  higher  °Following upper endoscopy (EGD, EUS, ERCP, esophageal dilation) °Vomiting of blood or coffee ground material  °New, significant abdominal pain  °New, significant chest pain or pain under the shoulder blades  °Painful or persistently difficult swallowing  °New shortness of breath  °Black, tarry-looking or red, bloody stools ° °FOLLOW UP:  °If any biopsies were taken you will be contacted by phone or by letter within the next 1-3 weeks. Call [phone]  if you have not heard about the biopsies in 3 weeks.  °Please also call with any specific questions about appointments or follow up tests. ° °

## 2021-01-11 NOTE — Transfer of Care (Signed)
Immediate Anesthesia Transfer of Care Note  Patient: Richard Silva  Procedure(s) Performed: COLONOSCOPY WITH PROPOFOL (N/A ) ENDOSCOPIC MUCOSAL RESECTION (N/A ) ESOPHAGOGASTRODUODENOSCOPY (EGD) WITH PROPOFOL (N/A ) BIOPSY POLYPECTOMY SUBMUCOSAL TATTOO INJECTION  Patient Location: Endoscopy Unit  Anesthesia Type:MAC  Level of Consciousness: drowsy  Airway & Oxygen Therapy: Patient Spontanous Breathing and Patient connected to face mask oxygen  Post-op Assessment: Report given to RN and Post -op Vital signs reviewed and stable  Post vital signs: Reviewed and stable  Last Vitals:  Vitals Value Taken Time  BP    Temp    Pulse    Resp    SpO2      Last Pain:  Vitals:   01/11/21 1141  TempSrc: Oral  PainSc: 0-No pain         Complications: No complications documented.

## 2021-01-11 NOTE — Anesthesia Postprocedure Evaluation (Signed)
Anesthesia Post Note  Patient: Richard Silva  Procedure(s) Performed: COLONOSCOPY WITH PROPOFOL (N/A ) ENDOSCOPIC MUCOSAL RESECTION (N/A ) ESOPHAGOGASTRODUODENOSCOPY (EGD) WITH PROPOFOL (N/A ) BIOPSY POLYPECTOMY SUBMUCOSAL TATTOO INJECTION     Patient location during evaluation: PACU Anesthesia Type: MAC Level of consciousness: awake and alert and oriented Pain management: pain level controlled Vital Signs Assessment: post-procedure vital signs reviewed and stable Respiratory status: spontaneous breathing, nonlabored ventilation and respiratory function stable Cardiovascular status: stable and blood pressure returned to baseline Postop Assessment: no apparent nausea or vomiting Anesthetic complications: no   No complications documented.  Last Vitals:  Vitals:   01/11/21 1502 01/11/21 1503  BP: 132/75 132/75  Pulse:  65  Resp:  12  Temp:    SpO2:  100%    Last Pain:  Vitals:   01/11/21 1503  TempSrc:   PainSc: 0-No pain                 Adrin Julian A.

## 2021-01-11 NOTE — Interval H&P Note (Signed)
History and Physical Interval Note:  01/11/2021 11:10 AM  Richard Silva  has presented today for surgery, with the diagnosis of Ascending Colon Polyp, Colon Cancer, Abnormal Colonoscopy.  The various methods of treatment have been discussed with the patient and family. After consideration of risks, benefits and other options for treatment, the patient has consented to  Procedure(s): COLONOSCOPY WITH PROPOFOL (N/A) ENDOSCOPIC MUCOSAL RESECTION (N/A) ESOPHAGOGASTRODUODENOSCOPY (EGD) WITH PROPOFOL (N/A) as a surgical intervention.  The patient's history has been reviewed, patient examined, no change in status, stable for surgery.  I have reviewed the patient's chart and labs.  Questions were answered to the patient's satisfaction.     [employer]

## 2021-01-11 NOTE — Op Note (Signed)
[facility] Patient Name: Richard Silva Procedure Date: 01/11/2021 MRN: [id number] Attending MD: Justice Britain , MD Date of Birth: 1946/08/02 CSN: [id number] Age: 75 Admit Type: Outpatient Procedure:                Upper GI endoscopy Indications:              Iron deficiency anemia Providers:                Justice Britain, MD, Josie Dixon, RN,                            Jeanella Cara, RN, Laverda Sorenson,                            Technician, Juanda Chance, CRNA Referring MD:             Sharon Mt. White MD, MD, Venugopal K.                            Vasireddy, MD Medicines:                Monitored Anesthesia Care Complications:            No immediate complications. Estimated Blood Loss:     Estimated blood loss was minimal. Procedure:                Pre-Anesthesia Assessment:                           - Prior to the procedure, a History and Physical                            was performed, and patient medications and                            allergies were reviewed. The patient's tolerance of                            previous anesthesia was also reviewed. The risks                            and benefits of the procedure and the sedation                            options and risks were discussed with the patient.                            All questions were answered, and informed consent                            was obtained. Prior Anticoagulants: The patient has                            taken no previous anticoagulant or antiplatelet                            agents.  ASA Grade Assessment: III - A patient with                            severe systemic disease. After reviewing the risks                            and benefits, the patient was deemed in                            satisfactory condition to undergo the procedure.                           After obtaining informed consent, the endoscope was                             passed under direct vision. Throughout the                            procedure, the patient's blood pressure, pulse, and                            oxygen saturations were monitored continuously. The                            GIF-H190 ([id number] Olympus gastroscope was                            introduced through the mouth, and advanced to the                            second part of duodenum. The upper GI endoscopy was                            accomplished without difficulty. The patient                            tolerated the procedure. Scope In: Scope Out: Findings:      No gross lesions were noted in the entire esophagus.      The Z-line was regular and was found 40 cm from the incisors.      Patchy mild inflammation characterized by erythema and friability was       found in the gastric body and in the gastric antrum.      No other gross lesions were noted in the entire examined stomach.       Biopsies were taken with a cold forceps for histology and Helicobacter       pylori testing.      No gross lesions were noted in the duodenal bulb, in the first portion       of the duodenum and in the second portion of the duodenum. Biopsies for       histology were taken with a cold forceps for evaluation of celiac       disease. Impression:               - No gross lesions in esophagus.                           -  Z-line regular, 40 cm from the incisors.                           - Gastritis in body/antrum. No other gross lesions                            in the stomach. Biopsied.                           - No gross lesions in the duodenal bulb, in the                            first portion of the duodenum and in the second                            portion of the duodenum. Biopsied. Moderate Sedation:      Not Applicable - Patient had care per Anesthesia. Recommendation:           - Proceed to scheduled colonoscopy.                           - Continue present  medications.                           - Await pathology results.                           - The findings and recommendations were discussed                            with the patient.                           - The findings and recommendations were discussed                            with the patient's family. Procedure Code(s):        --- Professional ---                           [phone], Esophagogastroduodenoscopy, flexible,                            transoral; with biopsy, single or multiple Diagnosis Code(s):        --- Professional ---                           K29.70, Gastritis, unspecified, without bleeding                           D50.9, Iron deficiency anemia, unspecified CPT copyright 2019 American Medical Association. All rights reserved. The codes documented in this report are preliminary and upon coder review may  be revised to meet current compliance requirements. Justice Britain, MD 01/11/2021 3:50:16 PM Number of Addenda: 0

## 2021-01-11 NOTE — Op Note (Signed)
[facility] Patient Name: Richard Silva Procedure Date: 01/11/2021 MRN: [id number] Attending MD: Justice Britain , MD Date of Birth: [birth year] CSN: [id number] Age: 75 Admit Type: Outpatient Procedure:                Colonoscopy Indications:              Excision of colonic polyp Providers:                Justice Britain, MD, Jeanella Cara, RN,                            Josie Dixon, RN, Laverda Sorenson, Technician,                            Juanda Chance, CRNA Referring MD:              Medicines:                Monitored Anesthesia Care Complications:            No immediate complications. Estimated Blood Loss:     Estimated blood loss was minimal. Procedure:                Pre-Anesthesia Assessment:                           - Prior to the procedure, a History and Physical                            was performed, and patient medications and                            allergies were reviewed. The patient's tolerance of                            previous anesthesia was also reviewed. The risks                            and benefits of the procedure and the sedation                            options and risks were discussed with the patient.                            All questions were answered, and informed consent                            was obtained. Prior Anticoagulants: The patient has                            taken no previous anticoagulant or antiplatelet                            agents. ASA Grade Assessment: III - A patient with                            severe  systemic disease. After reviewing the risks                            and benefits, the patient was deemed in                            satisfactory condition to undergo the procedure.                           After obtaining informed consent, the colonoscope                            was passed under direct vision. Throughout the                             procedure, the patient's blood pressure, pulse, and                            oxygen saturations were monitored continuously. The                            PCF-H190DL ([id number] Olympus pediatric colonscope                            was introduced through the anus and advanced to the                            5 cm into the ileum. The colonoscopy was                            technically difficult and complex. The patient                            tolerated the procedure. The quality of the bowel                            preparation was adequate. The terminal ileum,                            ileocecal valve, appendiceal orifice, and rectum                            were photographed. Scope In: 12:24:31 PM Scope Out: 2:53:01 PM Scope Withdrawal Time: 2 hours 24 minutes 4 seconds  Total Procedure Duration: 2 hours 28 minutes 30 seconds  Findings:      The digital rectal exam findings include hemorrhoids. Pertinent       negatives include no palpable rectal lesions.      The terminal ileum and ileocecal valve appeared normal.      A 22 mm polyp was found in the proximal ascending colon. The polyp was       sessile. Preparations were made for mucosal resection. NBI imaging &       White-light endoscopy was done to demarcate the borders of the lesion.       Orise gel  was injected to raise the lesion. Piecemeal mucosal resection       using a snare was performed. Resection and retrieval were complete.      A greater than 50 mm polyp was found in the mid ascending colon (5 folds       from IC Valve). The polyp was mixed and lateral spreading. Preparations       were made for mucosal resection. NBI imaging and White-light endoscopy       was done to demarcate the borders of the lesion. Orise gel was injected       to raise the lesion. Piecemeal mucosal resection using a snare was       performed in retroflexion and enfos. Resection and retrieval were       complete. To prevent bleeding  after mucosal resection, seven hemostatic       clips were successfully placed (MR conditional) - entire defect could       not be closed but major areas of potential bleeding concerned were       clipped. There was no bleeding at the end of the procedure. Tattoo       placed distal to the resection site      14 sessile and semi-sessile polyps were found in the descending colon       (5), transverse colon (4), ascending colon (3) and cecum (2). The polyps       were 2 to 15 mm in size. These polyps were removed with a cold snare.       Resection and retrieval were complete.      A fungating, infiltrative and ulcerated partially obstructing large mass       was found at the splenic flexure and in the distal transverse colon       (~55cm from the anal verge). The mass was partially circumferential       (involving one-half of the lumen circumference). The mass measured three       cm in length. Mild ooze noted at site. The site proximal and distal to       this area was found to have tattoo present.      Multiple small-mouthed diverticula were found in the recto-sigmoid       colon, sigmoid colon and hepatic flexure.      Normal mucosa was found in the entire colon otherwise.      Non-bleeding non-thrombosed external and internal hemorrhoids were found       during retroflexion, during perianal exam and during digital exam. The       hemorrhoids were Grade II (internal hemorrhoids that prolapse but reduce       spontaneously). Impression:               - Hemorrhoids found on digital rectal exam.                           - The examined portion of the ileum was normal.                           - One 22 mm polyp in the proximal ascending colon,                            removed with mucosal resection. Resected and  retrieved.                           - One greater than 50 mm polyp in the mid ascending                            colon, removed with mucosal  resection. Resected and                            retrieved. Clips (MR conditional) were placed.                            Tattoo placed distal to this resection.                           - 14 2 to 15 mm polyps in the descending colon, in                            the transverse colon, in the ascending colon and in                            the cecum, removed with a cold snare. Resected and                            retrieved.                           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                            Tattoo found proximal/distal to this site                           - Diverticulosis in the recto-sigmoid colon, in the                            sigmoid colon and at the hepatic flexure.                           - Normal mucosa in the entire examined colon                            otherwise.                           - Non-bleeding non-thrombosed external and internal                            hemorrhoids. Moderate Sedation:      Not Applicable - Patient had care per Anesthesia. Recommendation:           - The patient will be observed  post-procedure,                            until all discharge criteria are met.                           - Discharge patient to home.                           - Patient has a contact number available for                            emergencies. The signs and symptoms of potential                            delayed complications were discussed with the                            patient. Return to normal activities tomorrow.                            Written discharge instructions were provided to the                            patient.                           - High fiber diet.                           - Monitor for signs/symptoms of bleeding,                            perforation, and infection. If issues please call                             our number to get further assistance as needed.                           - No aspirin, ibuprofen, naproxen, or other                            non-steroidal anti-inflammatory drugs for 2 weeks                            after polyp removal.                           - Await pathology results.                           - Repeat colonoscopy in 6 months for surveillance                            based on pathology results.                           -  Follow up with Surgery for planned resection.                            Discussion as to extended Right hemicolectomy vs                            segmental colectomy based on final pathology and                            understanding of overall risk of potential polyp                            recurrence in future from large [facility] piecemeal                            resection.                           - The findings and recommendations were discussed                            with the patient.                           - The findings and recommendations were discussed                            with the patient's family. 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 All rights reserved. The codes documented in this report are preliminary and upon coder review may  be revised to meet current compliance requirements. Justice Britain, MD 01/11/2021 4:02:13 PM Number of Addenda: 0

## 2021-01-12 ENCOUNTER — Other Ambulatory Visit: Payer: Self-pay

## 2021-01-12 ENCOUNTER — Encounter: Payer: Self-pay | Admitting: Gastroenterology

## 2021-01-12 DIAGNOSIS — A048 Other specified bacterial intestinal infections: Secondary | ICD-10-CM

## 2021-01-12 LAB — SURGICAL PATHOLOGY

## 2021-01-12 MED ORDER — AMOXICILLIN 500 MG PO TABS: 1000.0000 mg | ORAL_TABLET | Freq: Two times a day (BID) | ORAL | 0 refills | Status: AC

## 2021-01-12 MED ORDER — METRONIDAZOLE 500 MG PO TABS: 500.0000 mg | ORAL_TABLET | Freq: Two times a day (BID) | ORAL | 0 refills | Status: AC

## 2021-01-12 MED ORDER — CLARITHROMYCIN 500 MG PO TABS: 500.0000 mg | ORAL_TABLET | Freq: Two times a day (BID) | ORAL | 0 refills | Status: AC

## 2021-01-12 MED ORDER — OMEPRAZOLE 20 MG PO CPDR: 20.0000 mg | DELAYED_RELEASE_CAPSULE | Freq: Two times a day (BID) | ORAL | 0 refills | Status: DC

## 2021-01-13 ENCOUNTER — Encounter (HOSPITAL_COMMUNITY): Payer: Self-pay | Admitting: Gastroenterology

## 2021-01-19 ENCOUNTER — Ambulatory Visit: Payer: Federal, State, Local not specified - PPO | Admitting: Gastroenterology

## 2021-02-03 ENCOUNTER — Other Ambulatory Visit (HOSPITAL_COMMUNITY): Payer: Self-pay

## 2021-02-11 NOTE — Progress Notes (Signed)
Sent message, via epic in basket, requesting orders in epic from surgeon.  

## 2021-02-15 ENCOUNTER — Ambulatory Visit: Payer: Self-pay | Admitting: Surgery

## 2021-02-15 NOTE — Progress Notes (Signed)
DUE TO COVID-19 ONLY ONE VISITOR IS ALLOWED TO COME WITH YOU AND STAY IN THE WAITING ROOM ONLY DURING PRE OP AND PROCEDURE DAY OF SURGERY. THE 1 VISITOR  MAY VISIT WITH YOU AFTER SURGERY IN YOUR PRIVATE ROOM DURING VISITING HOURS ONLY!  YOU NEED TO HAVE A COVID 19 TEST ON__5/12/2020 _____ @_______ , THIS TEST MUST BE DONE BEFORE SURGERY,  COVID TESTING SITE [address] [place] [address], IT IS ON THE RIGHT GOING OUT [address] APPROXIMATELY  2 MINUTES PAST [employer] ON THE RIGHT. ONCE YOUR COVID TEST IS COMPLETED,  PLEASE BEGIN THE QUARANTINE INSTRUCTIONS AS OUTLINED IN YOUR HANDOUT.                Richard Silva  02/15/2021   Your procedure is scheduled on:  02/19/2021   Report to [facility] Main  Entrance   Report to admitting at     0930 AM     Call this number if you have problems the morning of surgery [phone]           REMEMBER: FOLLOW ALL [place]. DRINK 2 PRESURGERY ENSURE DRINKS THE NIGHT BEFORE SURGERY AT 1000 PM AND 1 PRESURGERY DRINK THE DAY OF THE PROCEDURE 3 HOURS PRIOR TO SCHEDULED SURGERY.  NOTHING BY MOUTH EXCEPT CLEAR LIQUIDS UNTIL THREE HOURS PRIOR TO SCHEDULED SURGERY. PLEASE FINISH PRESURGERY 3RD  ENSURE  DRINK PER SURGEON ORDER 3 HOURS PRIOR TO SCHEDULED SURGERY TIME WHICH NEEDS TO BE COMPLETED AT ___0830AM ______.     CLEAR LIQUID DIET   Foods Allowed                                                                      Coffee and tea, regular and decaf                           Plain Jell-O any favor except red or purple                                         Fruit ices (not with fruit pulp)                                      Iced Popsicles                                     Carbonated beverages, regular and diet                                    Cranberry, grape and apple juices Sports drinks like Gatorade Lightly seasoned clear broth or  consume(fat free) Sugar, honey syrup  _____________________________________________________________________      BRUSH YOUR TEETH MORNING OF SURGERY AND RINSE YOUR MOUTH OUT, NO CHEWING GUM CANDY OR MINTS.     Take  these medicines the morning of surgery with A SIP OF WATER:  NONE   DO NOT TAKE ANY DIABETIC MEDICATIONS DAY OF YOUR SURGERY                               You may not have any metal on your body including hair pins and              piercings  Do not wear jewelry, make-up, lotions, powders or perfumes, deodorant             Do not wear nail polish on your fingernails.  Do not shave  48 hours prior to surgery.              Men may shave face and neck.   Do not bring valuables to the hospital. [place].  Contacts, dentures or bridgework may not be worn into surgery.  Leave suitcase in the car. After surgery it may be brought to your room.                 Please read over the following fact sheets you were given: _____________________________________________________________________  [facility] - Preparing for Surgery Before surgery, you can play an important role.  Because skin is not sterile, your skin needs to be as free of germs as possible.  You can reduce the number of germs on your skin by washing with CHG (chlorahexidine gluconate) soap before surgery.  CHG is an antiseptic cleaner which kills germs and bonds with the skin to continue killing germs even after washing. Please DO NOT use if you have an allergy to CHG or antibacterial soaps.  If your skin becomes reddened/irritated stop using the CHG and inform your nurse when you arrive at Short Stay. Do not shave (including legs and underarms) for at least 48 hours prior to the first CHG shower.  You may shave your face/neck. Please follow these instructions carefully:  1.  Shower with CHG Soap the night before surgery and the  morning of Surgery.  2.  If you choose to  wash your hair, wash your hair first as usual with your  normal  shampoo.  3.  After you shampoo, rinse your hair and body thoroughly to remove the  shampoo.                           4.  Use CHG as you would any other liquid soap.  You can apply chg directly  to the skin and wash                       Gently with a scrungie or clean washcloth.  5.  Apply the CHG Soap to your body ONLY FROM THE NECK DOWN.   Do not use on face/ open                           Wound or open sores. Avoid contact with eyes, ears mouth and genitals (private parts).                       Wash face,  Genitals (private parts) with your normal soap.  6.  Wash thoroughly, paying special attention to the area where your surgery  will be performed.  7.  Thoroughly rinse your body with warm water from the neck down.  8.  DO NOT shower/wash with your normal soap after using and rinsing off  the CHG Soap.                9.  Pat yourself dry with a clean towel.            10.  Wear clean pajamas.            11.  Place clean sheets on your bed the night of your first shower and do not  sleep with pets. Day of Surgery : Do not apply any lotions/deodorants the morning of surgery.  Please wear clean clothes to the hospital/surgery center.  FAILURE TO FOLLOW THESE INSTRUCTIONS MAY RESULT IN THE CANCELLATION OF YOUR SURGERY PATIENT SIGNATURE_________________________________  NURSE SIGNATURE__________________________________  ________________________________________________________________________

## 2021-02-15 NOTE — H&P (Signed)
CC: Referred by [facility] in [place] for colon cancer  HPI: [name] is a very pleasant 71yoM with hx of HTN, HLD, pre-DM, hypothyroidism and developed what he reports to be anemia on lab work approximately a year ago. He has been on iron since that time. He ultimately underwent colonoscopy in [place] which demonstrated multiple small polyps in the ileocecal region that were left in place, a polypoid lesion presumably in the distal ascending colon that was left in place for biopsied and tattooed and returned adenomatous. He was also found to have a mass located at 60-65 cm from the anal verge that was biopsied + tattoo'd and returned adenocarcinoma. This was felt to be somewhere in the vicinity of the splenic flexure. This colonoscopy was completed on 11/09/20. He underwent CT abdomen and pelvis but no CT of his chest.  The CT abd/pelvis dated 11/25/20 demonstrated some nonspecific wall thickening thickening in the mid transverse colon. 4 mm left lower lobe pulmonary nodules.  He was referred to [place]. He denies any complaints. He denies any abdominal pain, constipation, diarrhea, blood in the stool.  INTERVAL HX CT Chest 12/08/20 - 1. Small bilateral pulmonary nodules, including a perifissural nodule along the left major fissure. These nodules are likely benign, but given the patient's colon cancer history, recommend follow-up CT at 3-6 months. Fleischner criteria do not apply. 2. No acute chest findings. 3. Three-vessel coronary artery atherosclerosis. Central enlargement of the pulmonary arteries suggesting pulmonary arterial hypertension.  He denies any changes in his health or health history. He had colonoscopy with Dr. Rush Silva - numerous polyps removed including 2 mucosal resections of the right-sided colon all point lesions. 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 This was removed in a piecemeal manner and demonstrated  lymphovascular invasion as well as a component of poor differentiation.. The other polyp returned with high-grade dysplasia but no frank invasive component. He had multiple smaller polyps removed in between his cancer and these polyps as well. These were submitted as a group and did show some post size of high-grade dysplasia.. We obtained medical clearance for surgery. He has otherwise been well.   PMH: HTN, HLD, pre-DM, hypothyroidism  PSH: Denies  FHx: His father died of pneumonia but had laryngeal cancer; his mother had colon cancer. He denies any other family history of malignancy  Social: Denies use of tobacco/drugs; rare social etoh use. 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  ROS: A comprehensive 10 system review of systems was completed with the patient and pertinent findings as noted above.  The patient is a 75 year old male.   Allergies Richard Silva, CMA; 01/13/2021 10:59 AM) No Known Drug Allergies  [12/02/2020]: No Known Allergies  [12/02/2020]: Allergies Reconciled   Medication History Richard Silva, CMA; 01/13/2021 11:00 AM) amLODIPine Besylate (10MG  Tablet, Oral) Active. Atorvastatin Calcium (40MG  Tablet, Oral) Active. Clobetasol Propionate (0.05% Cream, External) Active. Doxycycline Hyclate (100MG  Tablet, Oral) Active. Ferrous Sulfate (325 (65 Fe)MG Tablet, Oral) Active. Fluticasone Propionate (50MCG/ACT Suspension, Nasal) Active. Levothyroxine Sodium (50MCG Capsule, Oral) Active. Triamcinolone Acetonide (0.1% Cream, External) Active. Spironolactone (25MG  Tablet, Oral) Active. Medications Reconciled    Review of Systems Richard Gave M. Orlo Brickle MD; 01/13/2021 11:49 AM) General Present- Weight Loss. Not Present- Appetite Loss, Chills, Fatigue, Fever, Night Sweats and Weight Gain. Skin Not Present- Change in Wart/Mole, Dryness, Hives, Jaundice, New Lesions, Non-Healing Wounds, Rash and Ulcer. HEENT Present- Seasonal Allergies and Wears  glasses/contact  lenses. Not Present- Earache, Hearing Loss, Hoarseness, Nose Bleed, Oral Ulcers, Ringing in the Ears, Sinus Pain, Sore Throat, Visual Disturbances and Yellow Eyes. Respiratory Present- Snoring. Not Present- Bloody sputum, Chronic Cough, Difficulty Breathing and Wheezing. Breast Not Present- Breast Mass, Breast Pain, Nipple Discharge and Skin Changes. Cardiovascular Present- Swelling of Extremities. Not Present- Chest Pain, Difficulty Breathing Lying Down, Leg Cramps, Palpitations, Rapid Heart Rate and Shortness of Breath. Gastrointestinal Present- Excessive gas. Not Present- Abdominal Pain, Bloating, Bloody Stool, Change in Bowel Habits, Chronic diarrhea, Constipation, Difficulty Swallowing, Gets full quickly at meals, Hemorrhoids, Indigestion, Nausea, Rectal Pain and Vomiting. Male Genitourinary Present- Nocturia. Not Present- Blood in Urine, Change in Urinary Stream, Frequency, Impotence, Painful Urination, Urgency and Urine Leakage. Musculoskeletal Present- Joint Pain. Not Present- Back Pain, Joint Stiffness, Muscle Pain, Muscle Weakness and Swelling of Extremities. Neurological Not Present- Decreased Memory, Fainting, Headaches, Numbness, Seizures, Tingling, Tremor, Trouble walking and Weakness. Psychiatric Not Present- Anxiety and Depression. Hematology Not Present- Abnormal Bleeding, Blood Clots and Blood Thinners.   Physical Exam Richard Gave M. Oluwadamilola Deliz MD; 01/13/2021 11:50 AM) The physical exam findings are as follows: Note: Constitutional: No acute distress; conversant; no deformities; wearing mask Eyes: Moist conjunctiva; no lid lag; anicteric sclerae; pupils equal and round Lungs: Normal respiratory effort CV: rrr; no palpable thrill; no pitting edema GI: Abdomen soft, nontender, nondistended; no palpable hepatosplenomegaly MSK: Normal gait Psychiatric: Appropriate affect; alert and oriented 3 Lymphatic: No palpable cervical or axillary lymphadenopathy    Assessment  & Plan Richard Gave M. Antonisha Waskey MD; 01/13/2021 11:53 AM) COLON CANCER (C18.9) Story: [name] is a very pleasant 51yoM hx HTN, HLD, pre-DM, hypothyroidism - presumed adenomatous polyp in distal ascending colon tattoo'd; adenocarcinoma in vicinity of splenic flexure (?) - 60-65 cm from verge; tattoo'd. Additionally multiple small 'ileocecal polyps' left in situ. Since had colonoscopy with numerous polyps cleared proximal to mass; some with HGD; 2 larger polyps in R colon removed - 1 had invasive adenoCA with LVI and some poor differentiation, piecemeal resection. Impression: -Medical clearance obtained -Staging CT Chest showed no evidence of metastasis - multiple small nodules favored benign -repeat CT in 3-6 months for surveillance -We spent time with him and his daughter today reviewing everything with him, including the relevant anatomy and physiology of the pertains to him as well as pathophysiology of colon cancer. Given findings, we discussed proceeding with subtotal colectomy (vs extended right hemicolectomy based on findings) with likely ileo-descending anastomosis. We will see him back for follow-up after GI.  -We reviewed robotic subtotal vs extended right hemicolectomy, ileo-desc/sigmoid anastomosis; flexible sigmoidoscopy -The planned procedures, material risks (including, but not limited to, pain, bleeding, infection, scarring, need for blood transfusion, damage to surrounding structures- blood vessels/nerves/viscus/organs, damage to ureter, urine leak, leak from anastomosis, need for additional procedures, worsening of pre-existing medical conditions, need for stoma which may be permanent, hernia, recurrence, DVT/PE, pneumonia, heart attack, stroke, death) benefits and alternatives to surgery were discussed at length. The patient's questions were answered to his satisfaction, he voiced understanding and elected to proceed with surgery. Additionally, we discussed typical postoperative expectations  and the recovery process - including quality of life expectations and BM expectations of on average 2-4 BMs per day  This patient encounter took 60 minutes today to perform the following: take history, perform exam, review outside records, interpret imaging, counsel the patient on their diagnosis and document encounter, findings & plan in the EHR  Signed by Richard Roup, MD (01/13/2021 11:54 AM)

## 2021-02-16 ENCOUNTER — Other Ambulatory Visit: Payer: Self-pay

## 2021-02-16 ENCOUNTER — Encounter (HOSPITAL_COMMUNITY)
Admission: RE | Admit: 2021-02-16 | Discharge: 2021-02-16 | Disposition: A | Discharge status: Home / Self Care | Payer: Federal, State, Local not specified - PPO | Source: Ambulatory Visit | Attending: Surgery | Admitting: Surgery

## 2021-02-16 ENCOUNTER — Other Ambulatory Visit (HOSPITAL_COMMUNITY)
Admission: RE | Admit: 2021-02-16 | Discharge: 2021-02-16 | Disposition: A | Discharge status: Home / Self Care | Payer: Federal, State, Local not specified - PPO | Source: Ambulatory Visit | Attending: Surgery | Admitting: Surgery

## 2021-02-16 ENCOUNTER — Encounter (HOSPITAL_COMMUNITY): Payer: Self-pay

## 2021-02-16 DIAGNOSIS — Z01812 Encounter for preprocedural laboratory examination: Secondary | ICD-10-CM | POA: Insufficient documentation

## 2021-02-16 DIAGNOSIS — Z20822 Contact with and (suspected) exposure to covid-19: Secondary | ICD-10-CM | POA: Diagnosis not present

## 2021-02-16 HISTORY — DX: Prediabetes: R73.03

## 2021-02-16 HISTORY — DX: Chronic kidney disease, unspecified: N18.9

## 2021-02-16 LAB — COMPREHENSIVE METABOLIC PANEL
ALT: 39 U/L (ref 0–44)
AST: 28 U/L (ref 15–41)
Albumin: 3.8 g/dL (ref 3.5–5.0)
Alkaline Phosphatase: 69 U/L (ref 38–126)
Anion gap: 8 (ref 5–15)
BUN: 40 mg/dL — ABNORMAL HIGH (ref 8–23)
CO2: 19 mmol/L — ABNORMAL LOW (ref 22–32)
Calcium: 9.6 mg/dL (ref 8.9–10.3)
Chloride: 106 mmol/L (ref 98–111)
Creatinine, Ser: 1.54 mg/dL — ABNORMAL HIGH (ref 0.61–1.24)
GFR, Estimated: 46 mL/min — ABNORMAL LOW (ref >60)
Glucose, Bld: 123 mg/dL — ABNORMAL HIGH (ref 70–99)
Potassium: 4.2 mmol/L (ref 3.5–5.1)
Sodium: 133 mmol/L — ABNORMAL LOW (ref 135–145)
Total Bilirubin: 0.6 mg/dL (ref 0.3–1.2)
Total Protein: 7.7 g/dL (ref 6.5–8.1)

## 2021-02-16 LAB — HEMOGLOBIN A1C
Hgb A1c MFr Bld: 6 % — ABNORMAL HIGH (ref 4.8–5.6)
Mean Plasma Glucose: 125.5 mg/dL

## 2021-02-16 LAB — CBC WITH DIFFERENTIAL/PLATELET
Abs Immature Granulocytes: 0.02 10*3/uL (ref 0.00–0.07)
Basophils Absolute: 0.1 10*3/uL (ref 0.0–0.1)
Basophils Relative: 1 %
Eosinophils Absolute: 1.4 10*3/uL — ABNORMAL HIGH (ref 0.0–0.5)
Eosinophils Relative: 18 %
HCT: 27.8 % — ABNORMAL LOW (ref 39.0–52.0)
Hemoglobin: 8.6 g/dL — ABNORMAL LOW (ref 13.0–17.0)
Immature Granulocytes: 0 %
Lymphocytes Relative: 20 %
Lymphs Abs: 1.5 10*3/uL (ref 0.7–4.0)
MCH: 27.8 pg (ref 26.0–34.0)
MCHC: 30.9 g/dL (ref 30.0–36.0)
MCV: 90 fL (ref 80.0–100.0)
Monocytes Absolute: 0.8 10*3/uL (ref 0.1–1.0)
Monocytes Relative: 10 %
Neutro Abs: 4 10*3/uL (ref 1.7–7.7)
Neutrophils Relative %: 51 %
Platelets: 453 10*3/uL — ABNORMAL HIGH (ref 150–400)
RBC: 3.09 MIL/uL — ABNORMAL LOW (ref 4.22–5.81)
RDW: 19.1 % — ABNORMAL HIGH (ref 11.5–15.5)
WBC: 7.8 10*3/uL (ref 4.0–10.5)
nRBC: 0 % (ref 0.0–0.2)

## 2021-02-16 LAB — APTT: aPTT: 32 seconds (ref 24–36)

## 2021-02-16 LAB — SARS CORONAVIRUS 2 (TAT 6-24 HRS): SARS Coronavirus 2: NEGATIVE

## 2021-02-16 LAB — GLUCOSE, CAPILLARY: Glucose-Capillary: 124 mg/dL — ABNORMAL HIGH (ref 70–99)

## 2021-02-16 LAB — PROTIME-INR
INR: 1.1 (ref 0.8–1.2)
Prothrombin Time: 13.7 seconds (ref 11.4–15.2)

## 2021-02-16 NOTE — Progress Notes (Signed)
Heart rate at preop appt was 76 not 96 on preop appt of 02/16/21.

## 2021-02-16 NOTE — Progress Notes (Addendum)
Anesthesia Review:  PCP: [facility] - pt reports he had to be cleared for surgery.  Faxed office to request info.  LOV 01/14/21 on chart  Cardiologist : Chest x-ray : CT Chest 11/2020.   EKG : per pt done at PCP office - have requested  01/14/21- placed on chart.  Echo : Stress test: Cardiac Cath :  Activity level: can do a flight of stairs without difficulty  Sleep Study/ CPAP : no  Fasting Blood Sugar :      / Checks Blood Sugar -- times a day:   Blood Thinner/ Instructions /Last Dose: ASA / Instructions/ Last Dose :  prediabets- checks glucose 3 x per week  CBC and CMP done 02/16/21 routed to Dr Dema Severin.  Labs from [facility] on chart.  hgba1c-02/16/21-6.0 Roanna Banning made aware hgb- 8.6 on lab results of 02/16/21 along with elevated Bun and Creatinine.

## 2021-02-17 ENCOUNTER — Encounter (HOSPITAL_COMMUNITY): Payer: Self-pay

## 2021-02-18 MED ORDER — BUPIVACAINE LIPOSOME 1.3 % IJ SUSP
20.0000 mL | Freq: Once | INTRAMUSCULAR | Status: DC
  Filled 2021-02-18: qty 20

## 2021-02-19 ENCOUNTER — Other Ambulatory Visit: Payer: Self-pay

## 2021-02-19 ENCOUNTER — Inpatient Hospital Stay (HOSPITAL_COMMUNITY)
Admission: RE | Admit: 2021-02-19 | Discharge: 2021-02-22 | DRG: 330 | Disposition: A | Discharge status: Home / Self Care | Payer: Medicare Other | Attending: Surgery | Admitting: Surgery

## 2021-02-19 ENCOUNTER — Inpatient Hospital Stay (HOSPITAL_COMMUNITY): Payer: Medicare Other | Admitting: Physician Assistant

## 2021-02-19 ENCOUNTER — Encounter (HOSPITAL_COMMUNITY)
Admission: RE | Disposition: A | Discharge status: Home / Self Care | Payer: Self-pay | Source: Home / Self Care | Attending: Surgery

## 2021-02-19 ENCOUNTER — Inpatient Hospital Stay (HOSPITAL_COMMUNITY): Payer: Medicare Other | Admitting: Certified Registered Nurse Anesthetist

## 2021-02-19 DIAGNOSIS — E785 Hyperlipidemia, unspecified: Secondary | ICD-10-CM | POA: Diagnosis present

## 2021-02-19 DIAGNOSIS — Z800 Family history of malignant neoplasm of digestive organs: Secondary | ICD-10-CM | POA: Diagnosis not present

## 2021-02-19 DIAGNOSIS — Z802 Family history of malignant neoplasm of other respiratory and intrathoracic organs: Secondary | ICD-10-CM

## 2021-02-19 DIAGNOSIS — C188 Malignant neoplasm of overlapping sites of colon: Secondary | ICD-10-CM | POA: Diagnosis present

## 2021-02-19 DIAGNOSIS — D62 Acute posthemorrhagic anemia: Secondary | ICD-10-CM | POA: Diagnosis not present

## 2021-02-19 DIAGNOSIS — C189 Malignant neoplasm of colon, unspecified: Secondary | ICD-10-CM | POA: Diagnosis present

## 2021-02-19 DIAGNOSIS — I10 Essential (primary) hypertension: Secondary | ICD-10-CM | POA: Diagnosis present

## 2021-02-19 DIAGNOSIS — E039 Hypothyroidism, unspecified: Secondary | ICD-10-CM | POA: Diagnosis present

## 2021-02-19 LAB — ABO/RH: ABO/RH(D): AB POS

## 2021-02-19 SURGERY — COLECTOMY, SIGMOID, ROBOT-ASSISTED
Anesthesia: General | Site: Abdomen

## 2021-02-19 MED ORDER — HYDRALAZINE HCL 20 MG/ML IJ SOLN: 10.0000 mg | INTRAMUSCULAR | Status: DC | PRN

## 2021-02-19 MED ORDER — HEPARIN SODIUM (PORCINE) 5000 UNIT/ML IJ SOLN
5000.0000 [IU] | Freq: Once | INTRAMUSCULAR | Status: AC
  Administered 2021-02-19: 5000 [IU] via SUBCUTANEOUS
  Filled 2021-02-19: qty 1

## 2021-02-19 MED ORDER — FENTANYL CITRATE (PF) 100 MCG/2ML IJ SOLN
INTRAMUSCULAR | Status: DC | PRN
  Administered 2021-02-19 (×7): 50 ug via INTRAVENOUS
  Administered 2021-02-19: 25 ug via INTRAVENOUS

## 2021-02-19 MED ORDER — DEXAMETHASONE SODIUM PHOSPHATE 4 MG/ML IJ SOLN
INTRAMUSCULAR | Status: DC | PRN
  Administered 2021-02-19: 5 mg via INTRAVENOUS

## 2021-02-19 MED ORDER — TRAMADOL HCL 50 MG PO TABS
50.0000 mg | ORAL_TABLET | Freq: Four times a day (QID) | ORAL | Status: DC | PRN
  Administered 2021-02-20 – 2021-02-21 (×3): 50 mg via ORAL
  Filled 2021-02-19 (×3): qty 1

## 2021-02-19 MED ORDER — FENTANYL CITRATE (PF) 100 MCG/2ML IJ SOLN: 25.0000 ug | INTRAMUSCULAR | Status: DC | PRN

## 2021-02-19 MED ORDER — SIMETHICONE 80 MG PO CHEW
40.0000 mg | CHEWABLE_TABLET | Freq: Four times a day (QID) | ORAL | Status: DC | PRN
  Administered 2021-02-22: 40 mg via ORAL
  Filled 2021-02-19: qty 1

## 2021-02-19 MED ORDER — SPIRONOLACTONE 25 MG PO TABS
25.0000 mg | ORAL_TABLET | Freq: Every day | ORAL | Status: DC
  Administered 2021-02-20 – 2021-02-21 (×2): 25 mg via ORAL
  Filled 2021-02-19 (×2): qty 1

## 2021-02-19 MED ORDER — CHLORHEXIDINE GLUCONATE 0.12 % MT SOLN: 15.0000 mL | Freq: Once | OROMUCOSAL | Status: AC

## 2021-02-19 MED ORDER — BUPIVACAINE-EPINEPHRINE (PF) 0.25% -1:200000 IJ SOLN
INTRAMUSCULAR | Status: DC | PRN
  Administered 2021-02-19: 30 mL via PERINEURAL

## 2021-02-19 MED ORDER — PROPOFOL 500 MG/50ML IV EMUL
INTRAVENOUS | Status: AC
  Filled 2021-02-19: qty 50

## 2021-02-19 MED ORDER — ROCURONIUM BROMIDE 10 MG/ML (PF) SYRINGE
PREFILLED_SYRINGE | INTRAVENOUS | Status: AC
  Filled 2021-02-19: qty 10

## 2021-02-19 MED ORDER — BUPIVACAINE LIPOSOME 1.3 % IJ SUSP
INTRAMUSCULAR | Status: DC | PRN
  Administered 2021-02-19: 20 mL

## 2021-02-19 MED ORDER — IBUPROFEN 200 MG PO TABS
600.0000 mg | ORAL_TABLET | Freq: Four times a day (QID) | ORAL | Status: DC | PRN
  Administered 2021-02-20: 600 mg via ORAL
  Filled 2021-02-19: qty 3

## 2021-02-19 MED ORDER — SODIUM CHLORIDE 0.9 % IV SOLN: INTRAVENOUS | Status: DC | PRN

## 2021-02-19 MED ORDER — ACETAMINOPHEN 500 MG PO TABS
1000.0000 mg | ORAL_TABLET | ORAL | Status: AC
  Administered 2021-02-19: 1000 mg via ORAL
  Filled 2021-02-19: qty 2

## 2021-02-19 MED ORDER — CHLORHEXIDINE GLUCONATE CLOTH 2 % EX PADS: 6.0000 | MEDICATED_PAD | Freq: Once | CUTANEOUS | Status: DC

## 2021-02-19 MED ORDER — ALUM & MAG HYDROXIDE-SIMETH 200-200-20 MG/5ML PO SUSP
30.0000 mL | Freq: Four times a day (QID) | ORAL | Status: DC | PRN
  Administered 2021-02-22: 30 mL via ORAL
  Filled 2021-02-19: qty 30

## 2021-02-19 MED ORDER — METRONIDAZOLE 500 MG PO TABS
1000.0000 mg | ORAL_TABLET | ORAL | Status: DC
  Filled 2021-02-19: qty 2

## 2021-02-19 MED ORDER — PHENYLEPHRINE HCL (PRESSORS) 10 MG/ML IV SOLN
INTRAVENOUS | Status: AC
  Filled 2021-02-19: qty 4

## 2021-02-19 MED ORDER — LACTATED RINGERS IV SOLN: INTRAVENOUS | Status: DC

## 2021-02-19 MED ORDER — ENSURE SURGERY PO LIQD
237.0000 mL | Freq: Two times a day (BID) | ORAL | Status: DC
  Administered 2021-02-20 – 2021-02-22 (×3): 237 mL via ORAL

## 2021-02-19 MED ORDER — FENTANYL CITRATE (PF) 100 MCG/2ML IJ SOLN
INTRAMUSCULAR | Status: AC
  Filled 2021-02-19: qty 2

## 2021-02-19 MED ORDER — PHENYLEPHRINE 40 MCG/ML (10ML) SYRINGE FOR IV PUSH (FOR BLOOD PRESSURE SUPPORT)
PREFILLED_SYRINGE | INTRAVENOUS | Status: AC
  Filled 2021-02-19: qty 20

## 2021-02-19 MED ORDER — IRBESARTAN 150 MG PO TABS
150.0000 mg | ORAL_TABLET | Freq: Every day | ORAL | Status: DC
  Administered 2021-02-20 – 2021-02-22 (×3): 150 mg via ORAL
  Filled 2021-02-19 (×3): qty 1

## 2021-02-19 MED ORDER — LIDOCAINE 2% (20 MG/ML) 5 ML SYRINGE
INTRAMUSCULAR | Status: DC | PRN
  Administered 2021-02-19: 60 mg via INTRAVENOUS

## 2021-02-19 MED ORDER — 0.9 % SODIUM CHLORIDE (POUR BTL) OPTIME
TOPICAL | Status: DC | PRN
  Administered 2021-02-19: 2000 mL

## 2021-02-19 MED ORDER — ENSURE PRE-SURGERY PO LIQD
592.0000 mL | Freq: Once | ORAL | Status: DC
  Filled 2021-02-19: qty 592

## 2021-02-19 MED ORDER — FENTANYL CITRATE (PF) 250 MCG/5ML IJ SOLN
INTRAMUSCULAR | Status: AC
  Filled 2021-02-19: qty 5

## 2021-02-19 MED ORDER — SODIUM CHLORIDE 0.9 % IV SOLN
2.0000 g | INTRAVENOUS | Status: AC
  Administered 2021-02-19: 2 g via INTRAVENOUS
  Filled 2021-02-19: qty 2

## 2021-02-19 MED ORDER — ONDANSETRON HCL 4 MG/2ML IJ SOLN: 4.0000 mg | Freq: Four times a day (QID) | INTRAMUSCULAR | Status: DC | PRN

## 2021-02-19 MED ORDER — OXYCODONE HCL 5 MG/5ML PO SOLN: 5.0000 mg | Freq: Once | ORAL | Status: DC | PRN

## 2021-02-19 MED ORDER — PROPOFOL 10 MG/ML IV BOLUS
INTRAVENOUS | Status: AC
  Filled 2021-02-19: qty 20

## 2021-02-19 MED ORDER — DIPHENHYDRAMINE HCL 12.5 MG/5ML PO ELIX
12.5000 mg | ORAL_SOLUTION | Freq: Four times a day (QID) | ORAL | Status: DC | PRN
  Filled 2021-02-19: qty 5

## 2021-02-19 MED ORDER — FLUTICASONE PROPIONATE 50 MCG/ACT NA SUSP
1.0000 | Freq: Every day | NASAL | Status: DC | PRN
  Filled 2021-02-19: qty 16

## 2021-02-19 MED ORDER — TRAMADOL HCL 50 MG PO TABS: 50.0000 mg | ORAL_TABLET | Freq: Four times a day (QID) | ORAL | 0 refills | Status: AC | PRN

## 2021-02-19 MED ORDER — ROCURONIUM BROMIDE 10 MG/ML (PF) SYRINGE
PREFILLED_SYRINGE | INTRAVENOUS | Status: DC | PRN
  Administered 2021-02-19: 70 mg via INTRAVENOUS
  Administered 2021-02-19: 30 mg via INTRAVENOUS
  Administered 2021-02-19: 20 mg via INTRAVENOUS

## 2021-02-19 MED ORDER — BUPIVACAINE-EPINEPHRINE (PF) 0.25% -1:200000 IJ SOLN
INTRAMUSCULAR | Status: AC
  Filled 2021-02-19: qty 30

## 2021-02-19 MED ORDER — ONDANSETRON HCL 4 MG/2ML IJ SOLN
INTRAMUSCULAR | Status: DC | PRN
  Administered 2021-02-19: 4 mg via INTRAVENOUS

## 2021-02-19 MED ORDER — DIPHENHYDRAMINE HCL 50 MG/ML IJ SOLN: 12.5000 mg | Freq: Four times a day (QID) | INTRAMUSCULAR | Status: DC | PRN

## 2021-02-19 MED ORDER — INDOCYANINE GREEN 25 MG IV SOLR
INTRAVENOUS | Status: DC | PRN
  Administered 2021-02-19: 7.5 mg via INTRAVENOUS

## 2021-02-19 MED ORDER — HEPARIN SODIUM (PORCINE) 5000 UNIT/ML IJ SOLN
5000.0000 [IU] | Freq: Three times a day (TID) | INTRAMUSCULAR | Status: DC
  Administered 2021-02-20 – 2021-02-22 (×7): 5000 [IU] via SUBCUTANEOUS
  Filled 2021-02-19 (×7): qty 1

## 2021-02-19 MED ORDER — HYDROMORPHONE HCL 1 MG/ML IJ SOLN
0.5000 mg | INTRAMUSCULAR | Status: DC | PRN
  Administered 2021-02-20 (×2): 0.5 mg via INTRAVENOUS
  Filled 2021-02-19 (×2): qty 0.5

## 2021-02-19 MED ORDER — PROPOFOL 500 MG/50ML IV EMUL
INTRAVENOUS | Status: DC | PRN
  Administered 2021-02-19: 25 ug/kg/min via INTRAVENOUS

## 2021-02-19 MED ORDER — AMLODIPINE BESYLATE 10 MG PO TABS
10.0000 mg | ORAL_TABLET | Freq: Every day | ORAL | Status: DC
  Administered 2021-02-20 – 2021-02-21 (×2): 10 mg via ORAL
  Filled 2021-02-19 (×2): qty 1

## 2021-02-19 MED ORDER — ALBUMIN HUMAN 5 % IV SOLN
INTRAVENOUS | Status: AC
  Filled 2021-02-19: qty 250

## 2021-02-19 MED ORDER — ALVIMOPAN 12 MG PO CAPS
12.0000 mg | ORAL_CAPSULE | Freq: Two times a day (BID) | ORAL | Status: DC
  Administered 2021-02-20: 12 mg via ORAL
  Filled 2021-02-19: qty 1

## 2021-02-19 MED ORDER — ALBUMIN HUMAN 5 % IV SOLN: INTRAVENOUS | Status: DC | PRN

## 2021-02-19 MED ORDER — ALVIMOPAN 12 MG PO CAPS
12.0000 mg | ORAL_CAPSULE | ORAL | Status: AC
  Administered 2021-02-19: 12 mg via ORAL
  Filled 2021-02-19: qty 1

## 2021-02-19 MED ORDER — LORATADINE 10 MG PO TABS: 10.0000 mg | ORAL_TABLET | Freq: Every day | ORAL | Status: DC | PRN

## 2021-02-19 MED ORDER — EPHEDRINE SULFATE-NACL 50-0.9 MG/10ML-% IV SOSY
PREFILLED_SYRINGE | INTRAVENOUS | Status: DC | PRN
  Administered 2021-02-19: 5 mg via INTRAVENOUS

## 2021-02-19 MED ORDER — ENSURE PRE-SURGERY PO LIQD
296.0000 mL | Freq: Once | ORAL | Status: DC
  Filled 2021-02-19: qty 296

## 2021-02-19 MED ORDER — ACETAMINOPHEN 500 MG PO TABS
1000.0000 mg | ORAL_TABLET | Freq: Four times a day (QID) | ORAL | Status: DC
  Administered 2021-02-19 – 2021-02-22 (×10): 1000 mg via ORAL
  Filled 2021-02-19 (×10): qty 2

## 2021-02-19 MED ORDER — NEOMYCIN SULFATE 500 MG PO TABS
1000.0000 mg | ORAL_TABLET | ORAL | Status: DC
  Filled 2021-02-19: qty 2

## 2021-02-19 MED ORDER — ORAL CARE MOUTH RINSE
15.0000 mL | Freq: Once | OROMUCOSAL | Status: AC
  Administered 2021-02-19: 15 mL via OROMUCOSAL

## 2021-02-19 MED ORDER — PHENYLEPHRINE HCL-NACL 10-0.9 MG/250ML-% IV SOLN
INTRAVENOUS | Status: DC | PRN
  Administered 2021-02-19: 30 ug/min via INTRAVENOUS

## 2021-02-19 MED ORDER — ONDANSETRON HCL 4 MG PO TABS
4.0000 mg | ORAL_TABLET | Freq: Four times a day (QID) | ORAL | Status: DC | PRN
  Filled 2021-02-19: qty 1

## 2021-02-19 MED ORDER — ONDANSETRON HCL 4 MG/2ML IJ SOLN
INTRAMUSCULAR | Status: AC
  Filled 2021-02-19: qty 2

## 2021-02-19 MED ORDER — POLYETHYLENE GLYCOL 3350 17 GM/SCOOP PO POWD
1.0000 | Freq: Once | ORAL | Status: DC
  Filled 2021-02-19: qty 255

## 2021-02-19 MED ORDER — PHENYLEPHRINE 40 MCG/ML (10ML) SYRINGE FOR IV PUSH (FOR BLOOD PRESSURE SUPPORT)
PREFILLED_SYRINGE | INTRAVENOUS | Status: DC | PRN
  Administered 2021-02-19 (×3): 80 ug via INTRAVENOUS
  Administered 2021-02-19: 120 ug via INTRAVENOUS
  Administered 2021-02-19: 80 ug via INTRAVENOUS
  Administered 2021-02-19: 120 ug via INTRAVENOUS
  Administered 2021-02-19: 80 ug via INTRAVENOUS

## 2021-02-19 MED ORDER — PROPOFOL 10 MG/ML IV BOLUS
INTRAVENOUS | Status: DC | PRN
  Administered 2021-02-19: 120 mg via INTRAVENOUS

## 2021-02-19 MED ORDER — BISACODYL 5 MG PO TBEC: 20.0000 mg | DELAYED_RELEASE_TABLET | Freq: Once | ORAL | Status: DC

## 2021-02-19 MED ORDER — APREPITANT 40 MG PO CAPS
40.0000 mg | ORAL_CAPSULE | Freq: Once | ORAL | Status: AC
  Administered 2021-02-19: 40 mg via ORAL
  Filled 2021-02-19: qty 1

## 2021-02-19 MED ORDER — SUGAMMADEX SODIUM 200 MG/2ML IV SOLN
INTRAVENOUS | Status: DC | PRN
  Administered 2021-02-19: 300 mg via INTRAVENOUS

## 2021-02-19 MED ORDER — FERROUS SULFATE 325 (65 FE) MG PO TABS
325.0000 mg | ORAL_TABLET | Freq: Every morning | ORAL | Status: DC
  Administered 2021-02-20 – 2021-02-22 (×3): 325 mg via ORAL
  Filled 2021-02-19 (×3): qty 1

## 2021-02-19 MED ORDER — PHENYLEPHRINE HCL (PRESSORS) 10 MG/ML IV SOLN
INTRAVENOUS | Status: AC
  Filled 2021-02-19: qty 1

## 2021-02-19 MED ORDER — LACTATED RINGERS IR SOLN
Status: DC | PRN
  Administered 2021-02-19: 1000 mL

## 2021-02-19 MED ORDER — EPHEDRINE 5 MG/ML INJ
INTRAVENOUS | Status: AC
  Filled 2021-02-19: qty 10

## 2021-02-19 MED ORDER — DEXAMETHASONE SODIUM PHOSPHATE 10 MG/ML IJ SOLN
INTRAMUSCULAR | Status: AC
  Filled 2021-02-19: qty 1

## 2021-02-19 MED ORDER — ONDANSETRON HCL 4 MG/2ML IJ SOLN: 4.0000 mg | Freq: Once | INTRAMUSCULAR | Status: DC | PRN

## 2021-02-19 MED ORDER — LIDOCAINE 2% (20 MG/ML) 5 ML SYRINGE
INTRAMUSCULAR | Status: AC
  Filled 2021-02-19: qty 5

## 2021-02-19 MED ORDER — OXYCODONE HCL 5 MG PO TABS
5.0000 mg | ORAL_TABLET | Freq: Once | ORAL | Status: DC | PRN
Start: 2021-02-19 — End: 2021-02-19

## 2021-02-19 SURGICAL SUPPLY — 114 items
APPLIER CLIP 5 13 M/L LIGAMAX5 (MISCELLANEOUS)
APPLIER CLIP ROT 10 11.4 M/L (STAPLE)
BLADE EXTENDED COATED 6.5IN (ELECTRODE) ×3 IMPLANT
CANNULA REDUC XI 12-8 STAPL (CANNULA) ×1
CANNULA REDUCER 12-8 DVNC XI (CANNULA) ×2 IMPLANT
CELLS DAT CNTRL 66122 CELL SVR (MISCELLANEOUS) IMPLANT
CHLORAPREP W/TINT 26 (MISCELLANEOUS) ×3 IMPLANT
CLIP APPLIE 5 13 M/L LIGAMAX5 (MISCELLANEOUS) IMPLANT
CLIP APPLIE ROT 10 11.4 M/L (STAPLE) IMPLANT
CLIP VESOLOCK LG 6/CT PURPLE (CLIP) IMPLANT
CLIP VESOLOCK MED LG 6/CT (CLIP) IMPLANT
COVER SURGICAL LIGHT HANDLE (MISCELLANEOUS) ×6 IMPLANT
COVER TIP SHEARS 8 DVNC (MISCELLANEOUS) ×2 IMPLANT
COVER TIP SHEARS 8MM DA VINCI (MISCELLANEOUS) ×1
COVER WAND RF STERILE (DRAPES) IMPLANT
DECANTER SPIKE VIAL GLASS SM (MISCELLANEOUS) ×3 IMPLANT
DEVICE TROCAR PUNCTURE CLOSURE (ENDOMECHANICALS) IMPLANT
DRAIN CHANNEL 19F RND (DRAIN) ×3 IMPLANT
DRAPE ARM DVNC X/XI (DISPOSABLE) ×8 IMPLANT
DRAPE COLUMN DVNC XI (DISPOSABLE) ×2 IMPLANT
DRAPE DA VINCI XI ARM (DISPOSABLE) ×4
DRAPE DA VINCI XI COLUMN (DISPOSABLE) ×1
DRAPE SURG IRRIG POUCH 19X23 (DRAPES) ×3 IMPLANT
DRSG OPSITE POSTOP 3X4 (GAUZE/BANDAGES/DRESSINGS) ×3 IMPLANT
DRSG OPSITE POSTOP 4X10 (GAUZE/BANDAGES/DRESSINGS) IMPLANT
DRSG OPSITE POSTOP 4X6 (GAUZE/BANDAGES/DRESSINGS) IMPLANT
DRSG OPSITE POSTOP 4X8 (GAUZE/BANDAGES/DRESSINGS) IMPLANT
DRSG TEGADERM 2-3/8X2-3/4 SM (GAUZE/BANDAGES/DRESSINGS) ×15 IMPLANT
DRSG TEGADERM 4X4.75 (GAUZE/BANDAGES/DRESSINGS) ×3 IMPLANT
ELECT REM PT RETURN 15FT ADLT (MISCELLANEOUS) ×3 IMPLANT
ENDOLOOP SUT PDS II  0 18 (SUTURE)
ENDOLOOP SUT PDS II 0 18 (SUTURE) IMPLANT
EVACUATOR SILICONE 100CC (DRAIN) ×3 IMPLANT
GAUZE SPONGE 2X2 8PLY STRL LF (GAUZE/BANDAGES/DRESSINGS) ×2 IMPLANT
GAUZE SPONGE 4X4 12PLY STRL (GAUZE/BANDAGES/DRESSINGS) IMPLANT
GLOVE SURG ENC MOIS LTX SZ7.5 (GLOVE) ×9 IMPLANT
GLOVE SURG UNDER LTX SZ8 (GLOVE) ×9 IMPLANT
GOWN STRL REUS W/TWL XL LVL3 (GOWN DISPOSABLE) ×15 IMPLANT
GRASPER SUT TROCAR 14GX15 (MISCELLANEOUS) IMPLANT
HOLDER FOLEY CATH W/STRAP (MISCELLANEOUS) ×3 IMPLANT
KIT PROCEDURE DA VINCI SI (MISCELLANEOUS)
KIT PROCEDURE DVNC SI (MISCELLANEOUS) IMPLANT
KIT TURNOVER KIT A (KITS) ×3 IMPLANT
LIGASURE IMPACT 36 18CM CVD LR (INSTRUMENTS) ×3 IMPLANT
NEEDLE INSUFFLATION 14GA 120MM (NEEDLE) ×3 IMPLANT
PACK CARDIOVASCULAR III (CUSTOM PROCEDURE TRAY) ×3 IMPLANT
PACK COLON (CUSTOM PROCEDURE TRAY) ×3 IMPLANT
PAD POSITIONING PINK XL (MISCELLANEOUS) ×3 IMPLANT
PENCIL SMOKE EVACUATOR (MISCELLANEOUS) IMPLANT
PORT LAP GEL ALEXIS MED 5-9CM (MISCELLANEOUS) ×3 IMPLANT
PROTECTOR NERVE ULNAR (MISCELLANEOUS) ×6 IMPLANT
RELOAD PROXIMATE 75MM BLUE (ENDOMECHANICALS) ×6 IMPLANT
RELOAD STAPLER 3.5X45 BLU DVNC (STAPLE) IMPLANT
RELOAD STAPLER 3.5X60 BLU DVNC (STAPLE) IMPLANT
RELOAD STAPLER 4.3X45 GRN DVNC (STAPLE) IMPLANT
RELOAD STAPLER 4.3X60 GRN DVNC (STAPLE) IMPLANT
RTRCTR WOUND ALEXIS 18CM MED (MISCELLANEOUS)
SCISSORS LAP 5X35 DISP (ENDOMECHANICALS) IMPLANT
SEAL CANN UNIV 5-8 DVNC XI (MISCELLANEOUS) ×8 IMPLANT
SEAL XI 5MM-8MM UNIVERSAL (MISCELLANEOUS) ×4
SEALER VESSEL DA VINCI XI (MISCELLANEOUS) ×1
SEALER VESSEL EXT DVNC XI (MISCELLANEOUS) ×2 IMPLANT
SET IRRIG TUBING LAPAROSCOPIC (IRRIGATION / IRRIGATOR) ×3 IMPLANT
SLEEVE ADV FIXATION 5X100MM (TROCAR) ×3 IMPLANT
SOLUTION ELECTROLUBE (MISCELLANEOUS) ×3 IMPLANT
SPONGE GAUZE 2X2 STER 10/PKG (GAUZE/BANDAGES/DRESSINGS) ×1
STAPLER 90 3.5 STAND SLIM (STAPLE)
STAPLER 90 3.5 STD SLIM (STAPLE) IMPLANT
STAPLER CANNULA SEAL DVNC XI (STAPLE) ×2 IMPLANT
STAPLER CANNULA SEAL XI (STAPLE) ×1
STAPLER ECHELON POWER CIR 29 (STAPLE) IMPLANT
STAPLER ECHELON POWER CIR 31 (STAPLE) IMPLANT
STAPLER PROXIMATE 75MM BLUE (STAPLE) ×3 IMPLANT
STAPLER RELOAD 3.5X45 BLU DVNC (STAPLE)
STAPLER RELOAD 3.5X45 BLUE (STAPLE)
STAPLER RELOAD 3.5X60 BLU DVNC (STAPLE)
STAPLER RELOAD 3.5X60 BLUE (STAPLE)
STAPLER RELOAD 4.3X45 GREEN (STAPLE)
STAPLER RELOAD 4.3X45 GRN DVNC (STAPLE)
STAPLER RELOAD 4.3X60 GREEN (STAPLE)
STAPLER RELOAD 4.3X60 GRN DVNC (STAPLE)
STAPLER SHEATH (SHEATH) ×1
STAPLER SHEATH ENDOWRIST DVNC (SHEATH) ×2 IMPLANT
STOPCOCK 4 WAY LG BORE MALE ST (IV SETS) ×6 IMPLANT
SURGILUBE 2OZ TUBE FLIPTOP (MISCELLANEOUS) ×3 IMPLANT
SUT MNCRL AB 4-0 PS2 18 (SUTURE) ×3 IMPLANT
SUT PDS AB 1 CT1 27 (SUTURE) ×6 IMPLANT
SUT PDS AB 1 TP1 96 (SUTURE) IMPLANT
SUT PROLENE 0 CT 2 (SUTURE) IMPLANT
SUT PROLENE 2 0 KS (SUTURE) ×3 IMPLANT
SUT PROLENE 2 0 SH DA (SUTURE) IMPLANT
SUT SILK 2 0 (SUTURE)
SUT SILK 2 0 SH CR/8 (SUTURE) IMPLANT
SUT SILK 2-0 18XBRD TIE 12 (SUTURE) IMPLANT
SUT SILK 3 0 (SUTURE) ×1
SUT SILK 3 0 SH CR/8 (SUTURE) ×3 IMPLANT
SUT SILK 3-0 18XBRD TIE 12 (SUTURE) ×2 IMPLANT
SUT V-LOC BARB 180 2/0GR6 GS22 (SUTURE)
SUT V-LOC BARB 180 2/0GR9 GS23 (SUTURE) ×6
SUT VIC AB 3-0 SH 18 (SUTURE) IMPLANT
SUT VIC AB 3-0 SH 27 (SUTURE)
SUT VIC AB 3-0 SH 27XBRD (SUTURE) IMPLANT
SUT VICRYL 0 UR6 27IN ABS (SUTURE) ×3 IMPLANT
SUTURE V-LC BRB 180 2/0GR6GS22 (SUTURE) IMPLANT
SUTURE V-LC BRB 180 2/0GR9GS23 (SUTURE) ×4 IMPLANT
SYR 10ML LL (SYRINGE) ×3 IMPLANT
SYS LAPSCP GELPORT 120MM (MISCELLANEOUS)
SYSTEM LAPSCP GELPORT 120MM (MISCELLANEOUS) IMPLANT
TAPE UMBILICAL 1/8 X36 TWILL (MISCELLANEOUS) ×3 IMPLANT
TOWEL OR NON WOVEN STRL DISP B (DISPOSABLE) ×3 IMPLANT
TRAY FOLEY MTR SLVR 16FR STAT (SET/KITS/TRAYS/PACK) ×3 IMPLANT
TROCAR ADV FIXATION 5X100MM (TROCAR) ×3 IMPLANT
TUBING CONNECTING 10 (TUBING) ×6 IMPLANT
TUBING INSUFFLATION 10FT LAP (TUBING) ×3 IMPLANT

## 2021-02-19 NOTE — Anesthesia Preprocedure Evaluation (Addendum)
Anesthesia Evaluation  Patient identified by MRN, date of birth, ID band Patient awake    Reviewed: Allergy & Precautions, NPO status , Patient's Chart, lab work & pertinent test results  History of Anesthesia Complications Negative for: history of anesthetic complications  Airway Mallampati: II  TM Distance: >3 FB Neck ROM: Full    Dental  (+) Dental Advisory Given, Teeth Intact   Pulmonary former smoker,    Pulmonary exam normal        Cardiovascular hypertension, Pt. on medications Normal cardiovascular exam     Neuro/Psych negative neurological ROS  negative psych ROS   GI/Hepatic Neg liver ROS,  Colon cancer    Endo/Other   Obesity Pre-DM Na 133   Renal/GU CRFRenal disease     Musculoskeletal  (+) Arthritis ,   Abdominal   Peds  Hematology  (+) anemia ,   Anesthesia Other Findings Covid test negative   Reproductive/Obstetrics                            Anesthesia Physical Anesthesia Plan  ASA: III  Anesthesia Plan: General   Post-op Pain Management:    Induction: Intravenous  PONV Risk Score and Plan: 4 or greater and Treatment may vary due to age or medical condition, Ondansetron, Aprepitant and Propofol infusion  Airway Management Planned: Oral ETT  Additional Equipment: None  Intra-op Plan:   Post-operative Plan: Extubation in OR  Informed Consent: I have reviewed the patients History and Physical, chart, labs and discussed the procedure including the risks, benefits and alternatives for the proposed anesthesia with the patient or authorized representative who has indicated his/her understanding and acceptance.     Dental advisory given  Plan Discussed with: CRNA and Anesthesiologist  Anesthesia Plan Comments:        Anesthesia Quick Evaluation

## 2021-02-19 NOTE — Op Note (Signed)
02/19/2021  4:06 PM  PATIENT:  Richard Silva  75 y.o. male  Patient Care Team: Vasireddy, Lanetta Inch, MD as PCP - General (Internal Medicine)  PRE-OPERATIVE DIAGNOSIS:  Colon cancer - ascending colon and splenic flexure  POST-OPERATIVE DIAGNOSIS:  Same  PROCEDURE:  1. Robotic-assisted extended right hemicolectomy with ileo-descending anastomosis 2.  Takedown of splenic flexure 3.  Intraoperative assessment of perfusion 4.  Bilateral transversus abdominis plane block  SURGEON:  Sharon Mt. Leslie Langille, MD  ASSISTANT: Leighton Ruff, MD  ANESTHESIA:   local and general  COUNTS:  Sponge, needle and instrument counts were reported correct x2 at the conclusion of the operation.  EBL: 50 mL  DRAINS: None  SPECIMEN: Terminal ileum, right colon, transverse colon-en bloc  COMPLICATIONS: None  FINDINGS: Tattoo in ascending colon.  Tattoo in the distal transverse colon.  Normal-appearing liver.  No evident peritoneal lesions.  Mass palpable in the distalmost transverse colon.  Splenic flexure was taken down to facilitate the procedure.  ICG was administered and demonstrated excellent perfusion of the descending colon.  An isoperistaltic side-to-side ileal descending anastomosis was created.  DISPOSITION: PACU in satisfactory condition  DESCRIPTION: The patient was identified in preop holding and taken to the OR where he was placed on the operating room table. SCDs were placed. General endotracheal anesthesia was induced without difficulty.  He was positioned in lithotomy using Allen stirrups.  Hair on the abdomen was clipped.  His arms were tucked.  He was secured to the operating table.  Pressure points were padded.  Hair on the abdomen was clipped.  Foley catheter was placed by nursing.  He was then prepped and draped in the usual sterile fashion. A surgical timeout was performed indicating the correct patient, procedure, positioning and need for preoperative antibiotics.   An OG  tube was placed to suction by anesthesia.  At Palmer's point, a stab incision was created.  The Veress needle was introduced in the peritoneal cavity on the first attempt using the aspiration and saline drop test.  Pneumoperitoneum was established to maximum pressure of 15 mmHg with CO2.  The abdomen was marked for the planned trocar sites.  This is in a line extending across the hypogastrium of the abdomen.  An 8 mm blunt tipped robotic trocar was cautiously placed just inferior to the right of the umbilicus.  Laparoscope was inserted and demonstrated no evidence of Veress needle or trocar site complications.  3 additional 8 mm trochars were placed along the premarked line.  An additional 5 mm assist port was placed in the right mid abdomen.  The abdomen was surveyed.  Bilateral transversus abdominis plane blocks.  Using dilute mixture of Exparel with 0.25% Marcaine with epinephrine.  The peritoneal surfaces are normal in appearance.  The liver is normal in appearance.  There is no evident masses or lesions.  There is tattoo readily identified in the ascending colon as well as in the distal most transverse colon approaching the splenic flexure.  He is positioned in trendelenburg with right side up. The robot is docked and I went to the console.  A medial to lateral approach was selected.  The cecum is elevated anteriorly and the small bowel draped inferior and laterally.  The ileocolic pedicle was readily identified.  The peritoneum overlying this is incised.  The ileocolic pedicle was then circumferentially dissected.  Working just lateral to this pedicle, the duodenum was identified.  This plane was developed clearing the mesentery from the anterior surface of the  duodenum.  The ileocolic pedicle had been circumferentially dissected.  This was then sealed and ligated using the vessel sealer.  The stump was inspected and noted to be hemostatic.  The plane is developed all the way up to the level of the hepatic  flexure by mobilizing the ascending mesocolon off of the retroperitoneum.  Attention was then turned to the lateral portion of the procedure.  The cecum was retracted medially.  The Brailee Riede line of Toldt was incised and the cecum and ascending colon were mobilized all up to the level of hepatic flexure.  He is then repositioned in reverse Trendelenburg.  The omentum was lifted anteriorly and the colon retracted caudad.  The lesser sac is entered and the omentum was dissected free from the transverse colon all the way over to the level of the hepatic flexure.  The transverse mesocolon was mobilized inferiorly.  The hepatic flexure was completely mobilized and released.  Omentum was then taken off of the transverse colon all the way out to the splenic flexure.  The associated mesocolon was mobilized inferiorly as well.  We then turned our attention to the ileocolic pedicle where the mesentery been divided.  At this point the colon I then mobilized such that all that was left was the mesenteric pedicles.  This is ligated staying in a plane well above the duodenum using the vessel sealer.  This works all the way out to the level of the middle colic vessels.  These were also sealed and divided using the vessel sealer.  Attention was then turned to mobilization of the splenic flexure.  All omentum had been cleared.  The attachments of the colon to the left upper quadrant are carefully freed mobilizing the mesocolon medially.  The descending colon is approached laterally first.  The peritoneum was incised and the Ryin Ambrosius line of Toldt released from the mid descending colon up to the level of splenic flexure.  The splenic flexure is now fully mobile.  We then turned our attention to the extracorporeal portion of the procedure  A periumbilical incision was created and the fascia incised in the midline.  An [place] wound protector was placed.  Towels were placed on the field.  The appendix was delivered out into the wound.  The  terminal ileum was identified.  This is divided using a 75 mm blue GIA stapler.  A Kary Kos is placed to help maintain orientation on the proximal staple line.  The ascending colon is also delivered out of the wound as is the transverse colon.  The distal transverse colon mass is palpated.  There is tattoo here.  The mesentery out to the level of planned distal division which is 5 cm distal to the mass and is on the descending colon is identified.  The intervening mesentery was ligated using the LigaSure.  We turned our attention at this point to performing an ICG perfusion test.  Using the robotic camera, ICG was administered first by anesthesia and then under near infrared lighting, were able to see excellent uptake of the ICG out to the level of the cleared colon.  A 75 mm blue load GIA stapler was then used to divide the colon.  The specimen was passed off.  Attention is then turned to creating the ileocolic anastomosis.  Orientation was confirmed to the distal ileum such that there is no twisting of the mesentery.  This lysed nicely in an isoperistaltic manner against the descending colon without any tension whatsoever.  A stay stitch  was placed.  An enterotomy was created in the ileum approximately 80 mm proximal to the staple line.  A colotomy was created and a ilio descending anastomosis was fashioned using a 75 mm GIA blue load stapler.  The anastomosis was inspected and complete with hemostasis.  A 3-0 silk stitch was placed at the apex anastomosis.  Attention then turned to closing the common enterotomy.  This was done in a [place] manner using 2-0 V-loc suture.  It was felt that a TA closure would potentially result in narrowing of the ileum and therefore the V-Loc utilized.  The anastomosis was palpated and noted to be widely patent, 3 fingerbreadths.  There is also no narrowing of the ileum going to the anastomosis.  This is then placed back into the abdomen.  The mesenteric defect is very large and  therefore left open.  The abdomen is inspected and hemostasis appreciated.  We then turned our attention to the clean portion of the procedure.  All instruments were passed off.  The wound protector was removed.  All equipment was exchanged for clean equipment, gowns, and gloves. Omentum is brought down over the midline.  The peritoneum was closed using a running 0 Vicryl suture.  The fascia at the extraction site was closed with 2 running #1 PDS sutures tied centrally.  The fascia is palpated noted to be completely closed.  Sponge, needle, and instrument counts reported correct x2.  Skin of all incision sites closed with running 4-0 Monocryl subcuticular suture.  Dermabond was applied to all incisions.  Honeycomb dressing was also placed over the extraction site.  He is then awakened from anesthesia, extubated, and transferred to stretcher for transport to PACU in satisfactory condition.

## 2021-02-19 NOTE — Discharge Instructions (Signed)
POST OP INSTRUCTIONS AFTER COLON SURGERY  1. DIET: Be sure to include lots of fluids daily to stay hydrated - 64oz of water per day (8, 8 oz glasses).  Avoid fast food or heavy meals for the first couple of weeks as your are more likely to get nauseated. Avoid raw/uncooked fruits or vegetables for the first 4 weeks (its ok to have these if they are blended into smoothie form). If you have fruits/vegetables, make sure they are cooked until soft enough to mash on the roof of your mouth and chew your food well. Otherwise, diet as tolerated.  2. Take your usually prescribed home medications unless otherwise directed.  3. PAIN CONTROL: a. Pain is best controlled by a usual combination of three different methods TOGETHER: i. Ice/Heat ii. Over the counter pain medication iii. Prescription pain medication b. Most patients will experience some swelling and bruising around the surgical site.  Ice packs or heating pads (30-60 minutes up to 6 times a day) will help. Some people prefer to use ice alone, heat alone, alternating between ice & heat.  Experiment to what works for you.  Swelling and bruising can take several weeks to resolve.   c. It is helpful to take an over-the-counter pain medication regularly for the first few weeks: i. Ibuprofen (Motrin/Advil) - 200mg tabs - take 3 tabs (600mg) every 6 hours as needed for pain (unless you have been directed previously to avoid NSAIDs/ibuprofen) ii. Acetaminophen (Tylenol) - you may take 650mg every 6 hours as needed. You can take this with motrin as they act differently on the body. If you are taking a narcotic pain medication that has acetaminophen in it, do not take over the counter tylenol at the same time. iii. NOTE: You may take both of these medications together - most patients  find it most helpful when alternating between the two (i.e. Ibuprofen at 6am, tylenol at 9am, ibuprofen at 12pm ...) d. A  prescription for pain medication should be given to you  upon discharge.  Take your pain medication as prescribed if your pain is not adequatly controlled with the over-the-counter pain reliefs mentioned above.  4. Avoid getting constipated.  Between the surgery and the pain medications, it is common to experience some constipation.  Increasing fluid intake and taking a fiber supplement (such as Metamucil, Citrucel, FiberCon, MiraLax, etc) 1-2 times a day regularly will usually help prevent this problem from occurring.  A mild laxative (prune juice, Milk of Magnesia, MiraLax, etc) should be taken according to package directions if there are no bowel movements after 48 hours.    5. Dressing: Your incisions are covered in Dermabond which is like sterile superglue for the skin. This will come off on it's own in a couple weeks. It is waterproof and you may bathe normally starting the day after your surgery in a shower. Avoid baths/pools/lakes/oceans until your wounds have fully healed.  6. ACTIVITIES as tolerated:   a. Avoid heavy lifting (>10lbs or 1 gallon of milk) for the next 6 weeks. b. You may resume regular daily activities as tolerated--such as daily self-care, walking, climbing stairs--gradually increasing activities as tolerated.  If you can walk 30 minutes without difficulty, it is safe to try more intense activity such as jogging, treadmill, bicycling, low-impact aerobics.  c. DO NOT PUSH THROUGH PAIN.  Let pain be your guide: If it hurts to do something, don't do it. d. You may drive when you are no longer taking prescription pain medication, you   can comfortably wear a seatbelt, and you can safely maneuver your car and apply brakes.  7. FOLLOW UP in our office a. Please call [facility] at [phone] to set up an appointment to see your surgeon in the office for a follow-up appointment approximately 2 weeks after your surgery. b. Make sure that you call for this appointment the day you arrive home to insure a convenient appointment time.  9. If you  have disability or family leave forms that need to be completed, you may have them completed by your primary care physician's office; for return to work instructions, please ask our office staff and they will be happy to assist you in obtaining this documentation   When to call us [phone]: 1. Poor pain control 2. Reactions / problems with new medications (rash/itching, etc)  3. Fever over 101.5 F (38.5 C) 4. Inability to urinate 5. Nausea/vomiting 6. Worsening swelling or bruising 7. Continued bleeding from incision. 8. Increased pain, redness, or drainage from the incision  The clinic staff is available to answer your questions during regular business hours (8:30am-5pm).  Please don't hesitate to call and ask to speak to one of our nurses for clinical concerns.   A surgeon from [facility] is always on call at the hospitals   If you have a medical emergency, go to the nearest emergency room or call 911.  [facility] [address], [address], [place], [place]  [address] MAIN: [phone] FAX: [phone] [email or web address] 

## 2021-02-19 NOTE — Anesthesia Postprocedure Evaluation (Signed)
Anesthesia Post Note  Patient: Richard Silva  Procedure(s) Performed: ROBOTIC EXTENDED RIGHT HEMI COLECTOMY TAKEDOWN OF SPLENIC FLEXURE AND  INTRAOPERATIVE ASSESSMENT OF PERFUSION WITH BILATERAL TAP BLOCK (N/A Abdomen)     Patient location during evaluation: PACU Anesthesia Type: General Level of consciousness: sedated Pain management: pain level controlled Vital Signs Assessment: post-procedure vital signs reviewed and stable Respiratory status: spontaneous breathing and respiratory function stable Cardiovascular status: stable Postop Assessment: no apparent nausea or vomiting Anesthetic complications: no   No complications documented.  Last Vitals:  Vitals:   02/19/21 1716 02/19/21 1819  BP: 110/65 112/70  Pulse: 79 75  Resp: 16   Temp: (!) 36.4 C   SpO2: 99% 99%    Last Pain:  Vitals:   02/19/21 1716  TempSrc: Oral  PainSc:                  Kendyn Zaman DANIEL

## 2021-02-19 NOTE — H&P (Signed)
CC: Referred by [facility] health system in [place] for colon cancer  HPI: Richard Silva is a very pleasant 23yoM with hx of HTN, HLD, pre-DM, hypothyroidism and developed what he reports to be anemia on lab work approximately a year ago. He has been on iron since that time. He ultimately underwent colonoscopy in [place] which demonstrated multiple small polyps in the ileocecal region that were left in place, a polypoid lesion presumably in the distal ascending colon that was left in place for biopsied and tattooed and returned adenomatous. He was also found to have a mass located at 60-65 cm from the anal verge that was biopsied + tattoo'd and returned adenocarcinoma. This was felt to be somewhere in the vicinity of the splenic flexure. This colonoscopy was completed on 11/09/20. He underwent CT abdomen and pelvis but no CT of his chest.  The CT abd/pelvis dated 11/25/20 demonstrated some nonspecific wall thickening thickening in the mid transverse colon. 4 mm left lower lobe pulmonary nodules.  He was referred to [place]. He denies any complaints. He denies any abdominal pain, constipation, diarrhea, blood in the stool.  CT Chest 12/08/20 - 1. Small bilateral pulmonary nodules, including a perifissural nodule along the left major fissure. These nodules are likely benign, but given the patient's colon cancer history, recommend follow-up CT at 3-6 months. Fleischner criteria do not apply. 2. No acute chest findings. 3. Three-vessel coronary artery atherosclerosis. Central enlargement of the pulmonary arteries suggesting pulmonary arterial hypertension.  He had colonoscopy with Dr. Rush Landmark - numerous polyps removed including 2 mucosal resections of the right-sided colon all point lesions. 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 This was removed in a piecemeal manner and demonstrated lymphovascular invasion as well as a component of poor differentiation..  The other polyp returned with high-grade dysplasia but no frank invasive component. He had multiple smaller polyps removed in between his cancer and these polyps as well. These were submitted as a group and did show some post size of high-grade dysplasia.. We obtained medical clearance for surgery. He has otherwise been well.  INTERVAL HX He denies any changes in his health or health history since we met in the office. He tolerated his bowel prep with satisfactory result. No abdominal pain or bloating.  PMH: HTN, HLD, pre-DM, hypothyroidism  PSH: Denies  FHx: His father died of pneumonia but had laryngeal cancer; his mother had colon cancer. He denies any other family history of malignancy  Social: Denies use of tobacco/drugs; rare social etoh use. 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  ROS: A comprehensive 10 system review of systems was completed with the patient and pertinent findings as noted above.  The patient is a 75 year old male.   Allergies Richard Silva, CMA; 01/13/2021 10:59 AM) No Known Drug Allergies  [12/02/2020]: No Known Allergies  [12/02/2020]: Allergies Reconciled   Medication History Richard Silva, CMA; 01/13/2021 11:00 AM) amLODIPine Besylate (10MG Tablet, Oral) Active. Atorvastatin Calcium (40MG Tablet, Oral) Active. Clobetasol Propionate (0.05% Cream, External) Active. Doxycycline Hyclate (100MG Tablet, Oral) Active. Ferrous Sulfate (325 (65 Fe)MG Tablet, Oral) Active. Fluticasone Propionate (50MCG/ACT Suspension, Nasal) Active. Levothyroxine Sodium (50MCG Capsule, Oral) Active. Triamcinolone Acetonide (0.1% Cream, External) Active. Spironolactone (25MG Tablet, Oral) Active. Medications Reconciled    Review of Systems Richard Silva; 01/13/2021 11:49 AM) General Present- Weight Loss. Not Present- Appetite Loss, Chills, Fatigue, Fever, Night Sweats and Weight Gain. Skin Not Present- Change in  Wart/Mole, Dryness,  Hives, Jaundice, New Lesions, Non-Healing Wounds, Rash and Ulcer. HEENT Present- Seasonal Allergies and Wears glasses/contact lenses. Not Present- Earache, Hearing Loss, Hoarseness, Nose Bleed, Oral Ulcers, Ringing in the Ears, Sinus Pain, Sore Throat, Visual Disturbances and Yellow Eyes. Respiratory Present- Snoring. Not Present- Bloody sputum, Chronic Cough, Difficulty Breathing and Wheezing. Breast Not Present- Breast Mass, Breast Pain, Nipple Discharge and Skin Changes. Cardiovascular Present- Swelling of Extremities. Not Present- Chest Pain, Difficulty Breathing Lying Down, Leg Cramps, Palpitations, Rapid Heart Rate and Shortness of Breath. Gastrointestinal Present- Excessive gas. Not Present- Abdominal Pain, Bloating, Bloody Stool, Change in Bowel Habits, Chronic diarrhea, Constipation, Difficulty Swallowing, Gets full quickly at meals, Hemorrhoids, Indigestion, Nausea, Rectal Pain and Vomiting. Male Genitourinary Present- Nocturia. Not Present- Blood in Urine, Change in Urinary Stream, Frequency, Impotence, Painful Urination, Urgency and Urine Leakage. Musculoskeletal Present- Joint Pain. Not Present- Back Pain, Joint Stiffness, Muscle Pain, Muscle Weakness and Swelling of Extremities. Neurological Not Present- Decreased Memory, Fainting, Headaches, Numbness, Seizures, Tingling, Tremor, Trouble walking and Weakness. Psychiatric Not Present- Anxiety and Depression. Hematology Not Present- Abnormal Bleeding, Blood Clots and Blood Thinners.   Physical Exam Richard Silva; 01/13/2021 11:50 AM) The physical exam findings are as follows: Note: Constitutional: No acute distress; conversant; no deformities; wearing mask Eyes: Moist conjunctiva; no lid lag; anicteric sclerae; pupils equal and round Lungs: Normal respiratory effort CV: rrr; no palpable thrill; no pitting edema GI: Abdomen soft, nontender, nondistended; no palpable hepatosplenomegaly MSK: Normal gait Psychiatric:  Appropriate affect; alert and oriented 3 Lymphatic: No palpable cervical or axillary lymphadenopathy    Assessment & Plan   Richard Silva is a very pleasant 71yoM hx HTN, HLD, pre-DM, hypothyroidism - presumed adenomatous polyp in distal ascending colon tattoo'd; adenocarcinoma in vicinity of splenic flexure (?) - 60-65 cm from verge; tattoo'd. Additionally multiple small 'ileocecal polyps' left in situ. Since had colonoscopy with numerous polyps cleared proximal to mass; some with HGD; 2 larger polyps in R colon removed - 1 had invasive adenoCA with LVI and some poor differentiation, piecemeal resection.  -Medical clearance obtained -Staging CT Chest showed no evidence of metastasis - multiple small nodules favored benign -repeat CT in 3-6 months for surveillance -We discussed proceeding with subtotal colectomy (vs extended right hemicolectomy based on findings) with likely ileo-descending anastomosis  -We reviewed robotic subtotal vs extended right hemicolectomy, ileo-desc/sigmoid anastomosis; flexible sigmoidoscopy -The planned procedures, material risks (including, but not limited to, pain, bleeding, infection, scarring, need for blood transfusion, damage to surrounding structures- blood vessels/nerves/viscus/organs, damage to ureter, urine leak, leak from anastomosis, need for additional procedures, worsening of pre-existing medical conditions, need for stoma which may be permanent, hernia, recurrence, DVT/PE, pneumonia, heart attack, stroke, death) benefits and alternatives to surgery were discussed at length. The patient's questions were answered to his satisfaction, he voiced understanding and elected to proceed with surgery. Additionally, we discussed typical postoperative expectations and the recovery process - including quality of life expectations and BM expectations of on average 2-4 BMs per day  Nadeen Landau, [facility] Use [email or web address] to contact on call  provider

## 2021-02-19 NOTE — Transfer of Care (Signed)
Immediate Anesthesia Transfer of Care Note  Patient: Richard Silva  Procedure(s) Performed: ROBOTIC EXTENDED RIGHT HEMI COLECTOMY TAKEDOWN OF SPLENIC FLEXURE AND  INTRAOPERATIVE ASSESSMENT OF PERFUSION WITH BILATERAL TAP BLOCK (N/A Abdomen)  Patient Location: PACU  Anesthesia Type:General  Level of Consciousness: sedated  Airway & Oxygen Therapy: Patient Spontanous Breathing and Patient connected to face mask oxygen  Post-op Assessment: Report given to RN and Post -op Vital signs reviewed and stable  Post vital signs: Reviewed and stable  Last Vitals:  Vitals Value Taken Time  BP 115/60 02/19/21 1550  Temp    Pulse 81 02/19/21 1552  Resp 13 02/19/21 1552  SpO2 99 % 02/19/21 1552  Vitals shown include unvalidated device data.  Last Pain:  Vitals:   02/19/21 0945  TempSrc: Oral         Complications: No complications documented.

## 2021-02-19 NOTE — Anesthesia Procedure Notes (Signed)
Date/Time: 02/19/2021 3:36 PM Performed by: Cynda Familia, CRNA Oxygen Delivery Method: Simple face mask Placement Confirmation: positive ETCO2 and breath sounds checked- equal and bilateral Dental Injury: Teeth and Oropharynx as per pre-operative assessment

## 2021-02-19 NOTE — Anesthesia Procedure Notes (Signed)
Procedure Name: Intubation Date/Time: 02/19/2021 11:40 AM Performed by: Claudia Desanctis, CRNA Pre-anesthesia Checklist: Patient identified, Emergency Drugs available, Suction available and Patient being monitored Patient Re-evaluated:Patient Re-evaluated prior to induction Oxygen Delivery Method: Circle system utilized Preoxygenation: Pre-oxygenation with 100% oxygen Induction Type: IV induction Ventilation: Mask ventilation without difficulty Laryngoscope Size: 2 and Miller Grade View: Grade I Tube type: Oral Tube size: 8.0 mm Number of attempts: 1 Airway Equipment and Method: Stylet Placement Confirmation: ETT inserted through vocal cords under direct vision,  positive ETCO2 and breath sounds checked- equal and bilateral Secured at: 22 cm Tube secured with: Tape Dental Injury: Teeth and Oropharynx as per pre-operative assessment

## 2021-02-20 ENCOUNTER — Other Ambulatory Visit: Payer: Self-pay

## 2021-02-20 ENCOUNTER — Encounter (HOSPITAL_COMMUNITY): Payer: Self-pay | Admitting: Surgery

## 2021-02-20 LAB — BASIC METABOLIC PANEL
Anion gap: 8 (ref 5–15)
BUN: 21 mg/dL (ref 8–23)
CO2: 18 mmol/L — ABNORMAL LOW (ref 22–32)
Calcium: 9 mg/dL (ref 8.9–10.3)
Chloride: 106 mmol/L (ref 98–111)
Creatinine, Ser: 1.49 mg/dL — ABNORMAL HIGH (ref 0.61–1.24)
GFR, Estimated: 48 mL/min — ABNORMAL LOW (ref >60)
Glucose, Bld: 136 mg/dL — ABNORMAL HIGH (ref 70–99)
Potassium: 4.3 mmol/L (ref 3.5–5.1)
Sodium: 132 mmol/L — ABNORMAL LOW (ref 135–145)

## 2021-02-20 LAB — CBC
HCT: 23.9 % — ABNORMAL LOW (ref 39.0–52.0)
Hemoglobin: 7.6 g/dL — ABNORMAL LOW (ref 13.0–17.0)
MCH: 28 pg (ref 26.0–34.0)
MCHC: 31.8 g/dL (ref 30.0–36.0)
MCV: 88.2 fL (ref 80.0–100.0)
Platelets: 359 10*3/uL (ref 150–400)
RBC: 2.71 MIL/uL — ABNORMAL LOW (ref 4.22–5.81)
RDW: 18.5 % — ABNORMAL HIGH (ref 11.5–15.5)
WBC: 9.5 10*3/uL (ref 4.0–10.5)
nRBC: 0 % (ref 0.0–0.2)

## 2021-02-20 NOTE — Progress Notes (Signed)
1 Day Post-Op   Subjective/Chief Complaint: Patient has some mild bloating.  Otherwise, pain control was good he is tolerating liquids without nausea and vomiting at this point.  He has not passed flatus but feels the urge and I encouraged him to do so.  He plans on walking later today   Objective: Vital signs in last 24 hours: Temp:  [97.4 F (36.3 C)-98.6 F (37 C)] 97.8 F (36.6 C) (05/07 0644) Pulse Rate:  [69-90] 69 (05/07 0644) Resp:  [10-18] 18 (05/07 0644) BP: (103-129)/(60-89) 112/89 (05/07 0644) SpO2:  [98 %-100 %] 100 % (05/07 0644)    Intake/Output from previous day: 05/06 0701 - 05/07 0700 In: 5016.5 [P.O.:1320; I.V.:3096.5; IV Piggyback:600] Out: 2409 [Urine:1825; Blood:10] Intake/Output this shift: No intake/output data recorded.  General appearance: alert and cooperative Resp: clear to auscultation bilaterally Cardio: regular rate and rhythm, S1, S2 normal, no murmur, click, rub or gallop Incision/Wound: Abdomen soft nontender without rebound or guarding.  Incision clean dry intact as well as port sites.  Dressings in place with minimal serosanguineous drainage noted.  Mild distention noted  Lab Results:  Recent Labs    02/20/21 0520  WBC 9.5  HGB 7.6*  HCT 23.9*  PLT 359   BMET Recent Labs    02/20/21 0520  NA 132*  K 4.3  CL 106  CO2 18*  GLUCOSE 136*  BUN 21  CREATININE 1.49*  CALCIUM 9.0   PT/INR No results for input(s): LABPROT, INR in the last 72 hours. ABG No results for input(s): PHART, HCO3 in the last 72 hours.  Invalid input(s): PCO2, PO2  Studies/Results: No results found.  Anti-infectives: Anti-infectives (From admission, onward)   Start     Dose/Rate Route Frequency Ordered Stop   02/19/21 1400  neomycin (MYCIFRADIN) tablet 1,000 mg  Status:  Discontinued       "And" Linked Group Details   1,000 mg Oral 3 times per day 02/19/21 0933 02/19/21 1709   02/19/21 1400  metroNIDAZOLE (FLAGYL) tablet 1,000 mg  Status:   Discontinued       "And" Linked Group Details   1,000 mg Oral 3 times per day 02/19/21 0933 02/19/21 1709   02/19/21 0945  cefoTEtan (CEFOTAN) 2 g in sodium chloride 0.9 % 100 mL IVPB        2 g 200 mL/hr over 30 Minutes Intravenous On call to O.R. 02/19/21 0933 02/19/21 1202      Assessment/Plan: s/p Procedure(s): ROBOTIC EXTENDED RIGHT HEMI COLECTOMY TAKEDOWN OF SPLENIC FLEXURE AND  INTRAOPERATIVE ASSESSMENT OF PERFUSION WITH BILATERAL TAP BLOCK (N/A) Continue clear liquids for now.  He is on a protocol.  Recommend ambulation which she is agreed to do.  He is a little bit bloated today therefore I told him to go slow with the eating and drinking.  He is anemic but baseline is anemic as well.  Certainly, if he gets dizzy with ambulation or develops more tachycardiac, he may require transfusion.  Since he is chronically anemic, I think this can be safely observed with certainly if he becomes symptomatic he may require blood products.  LOS: 1 day    Joyice Faster Cathlin Buchan 02/20/2021

## 2021-02-21 LAB — CBC
HCT: 23.7 % — ABNORMAL LOW (ref 39.0–52.0)
Hemoglobin: 7.3 g/dL — ABNORMAL LOW (ref 13.0–17.0)
MCH: 27.4 pg (ref 26.0–34.0)
MCHC: 30.8 g/dL (ref 30.0–36.0)
MCV: 89.1 fL (ref 80.0–100.0)
Platelets: 369 10*3/uL (ref 150–400)
RBC: 2.66 MIL/uL — ABNORMAL LOW (ref 4.22–5.81)
RDW: 18.9 % — ABNORMAL HIGH (ref 11.5–15.5)
WBC: 8.6 10*3/uL (ref 4.0–10.5)
nRBC: 0 % (ref 0.0–0.2)

## 2021-02-21 LAB — BASIC METABOLIC PANEL
Anion gap: 7 (ref 5–15)
BUN: 23 mg/dL (ref 8–23)
CO2: 19 mmol/L — ABNORMAL LOW (ref 22–32)
Calcium: 9 mg/dL (ref 8.9–10.3)
Chloride: 110 mmol/L (ref 98–111)
Creatinine, Ser: 1.42 mg/dL — ABNORMAL HIGH (ref 0.61–1.24)
GFR, Estimated: 51 mL/min — ABNORMAL LOW (ref >60)
Glucose, Bld: 94 mg/dL (ref 70–99)
Potassium: 4 mmol/L (ref 3.5–5.1)
Sodium: 136 mmol/L (ref 135–145)

## 2021-02-21 LAB — PREPARE RBC (CROSSMATCH)

## 2021-02-21 MED ORDER — SODIUM CHLORIDE 0.9% IV SOLUTION: Freq: Once | INTRAVENOUS | Status: AC

## 2021-02-21 NOTE — Progress Notes (Signed)
2 Days Post-Op   Subjective/Chief Complaint: Weak and tired Patient complains of fatigue especially when he stands.  He has no nausea or vomiting.  His bowels are moving.  They are loose.  There is no blood.   Objective: Vital signs in last 24 hours: Temp:  [97.5 F (36.4 C)-98.2 F (36.8 C)] 98.2 F (36.8 C) (05/08 0510) Pulse Rate:  [67-77] 77 (05/08 0510) Resp:  [18] 18 (05/08 0510) BP: (98-115)/(61-71) 105/67 (05/08 0510) SpO2:  [99 %-100 %] 99 % (05/08 0510) Last BM Date: 02/21/21  Intake/Output from previous day: 05/07 0701 - 05/08 0700 In: 1303.9 [P.O.:950; I.V.:353.9] Out: 1150 [Urine:1150] Intake/Output this shift: Total I/O In: 120 [P.O.:120] Out: 300 [Urine:300]  General appearance: alert and cooperative Resp: Work of breathing normal.  No wheezing Cardio: Normal sinus rhythm Incision/Wound: Incision clean dry intact.  Dressing in place with minimal drainage.  Distention present but improved from yesterday.  No evidence of peritonitis or mass  Lab Results:  Recent Labs    02/20/21 0520 02/21/21 0519  WBC 9.5 8.6  HGB 7.6* 7.3*  HCT 23.9* 23.7*  PLT 359 369   BMET Recent Labs    02/20/21 0520 02/21/21 0519  NA 132* 136  K 4.3 4.0  CL 106 110  CO2 18* 19*  GLUCOSE 136* 94  BUN 21 23  CREATININE 1.49* 1.42*  CALCIUM 9.0 9.0   PT/INR No results for input(s): LABPROT, INR in the last 72 hours. ABG No results for input(s): PHART, HCO3 in the last 72 hours.  Invalid input(s): PCO2, PO2  Studies/Results: No results found.  Anti-infectives: Anti-infectives (From admission, onward)   Start     Dose/Rate Route Frequency Ordered Stop   02/19/21 1400  neomycin (MYCIFRADIN) tablet 1,000 mg  Status:  Discontinued       "And" Linked Group Details   1,000 mg Oral 3 times per day 02/19/21 0933 02/19/21 1709   02/19/21 1400  metroNIDAZOLE (FLAGYL) tablet 1,000 mg  Status:  Discontinued       "And" Linked Group Details   1,000 mg Oral 3 times per day  02/19/21 0933 02/19/21 1709   02/19/21 0945  cefoTEtan (CEFOTAN) 2 g in sodium chloride 0.9 % 100 mL IVPB        2 g 200 mL/hr over 30 Minutes Intravenous On call to O.R. 02/19/21 0933 02/19/21 1202      Assessment/Plan: s/p Procedure(s): ROBOTIC EXTENDED RIGHT HEMI COLECTOMY TAKEDOWN OF SPLENIC FLEXURE AND  INTRAOPERATIVE ASSESSMENT OF PERFUSION WITH BILATERAL TAP BLOCK (N/A) Hemoglobin is down to 7.3.  He is quite fatigued especially with standing.  Discussed transfusion with him today and the pros and cons given his diagnosis of doing so.  I feel he is symptomatic at this point and this may limit his ability to recover.  Also, given his advanced age this may lead to further cardiovascular issues for him.  He is in agreement for transfusion after discussion of the pros and cons of this especially with his colon cancer diagnosis.  Advance diet as well today.  Continue to ambulate.  LOS: 2 days    Joyice Faster Zakiyyah Savannah 02/21/2021

## 2021-02-22 ENCOUNTER — Other Ambulatory Visit (HOSPITAL_COMMUNITY): Payer: Self-pay

## 2021-02-22 LAB — CBC
HCT: 31.8 % — ABNORMAL LOW (ref 39.0–52.0)
Hemoglobin: 10.3 g/dL — ABNORMAL LOW (ref 13.0–17.0)
MCH: 28.6 pg (ref 26.0–34.0)
MCHC: 32.4 g/dL (ref 30.0–36.0)
MCV: 88.3 fL (ref 80.0–100.0)
Platelets: 389 10*3/uL (ref 150–400)
RBC: 3.6 MIL/uL — ABNORMAL LOW (ref 4.22–5.81)
RDW: 18.1 % — ABNORMAL HIGH (ref 11.5–15.5)
WBC: 10 10*3/uL (ref 4.0–10.5)
nRBC: 0 % (ref 0.0–0.2)

## 2021-02-22 LAB — BASIC METABOLIC PANEL
Anion gap: 7 (ref 5–15)
BUN: 17 mg/dL (ref 8–23)
CO2: 21 mmol/L — ABNORMAL LOW (ref 22–32)
Calcium: 9.4 mg/dL (ref 8.9–10.3)
Chloride: 107 mmol/L (ref 98–111)
Creatinine, Ser: 1.12 mg/dL (ref 0.61–1.24)
GFR, Estimated: >60 mL/min (ref >60)
Glucose, Bld: 107 mg/dL — ABNORMAL HIGH (ref 70–99)
Potassium: 4.1 mmol/L (ref 3.5–5.1)
Sodium: 135 mmol/L (ref 135–145)

## 2021-02-22 LAB — BPAM RBC
Blood Product Expiration Date: 202205242359
ISSUE DATE / TIME: 202205081155
Unit Type and Rh: 6200

## 2021-02-22 LAB — TYPE AND SCREEN
ABO/RH(D): AB POS
Antibody Screen: NEGATIVE
Unit division: 0

## 2021-02-22 NOTE — Progress Notes (Signed)
D/C instructions given to patient. Patient has no questions. NT or writer will wheel patient out once his daughter comes back to pick him up

## 2021-02-22 NOTE — Discharge Summary (Signed)
Patient ID: Richard Silva MRN: [id number] DOB/AGE: 1946/02/25 75 y.o.  Admit date: 02/19/2021 Discharge date: 02/22/2021  Discharge Diagnoses Patient Active Problem List   Diagnosis Date Noted  . Colon cancer ([place]) 02/19/2021  . Iron deficiency anemia due to chronic blood loss 12/22/2020  . Abnormal colonoscopy 12/22/2020  . History of colonic polyps 12/22/2020  . 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  Hospital Course:  He was admitted postoperatively where he recovered appropriately. His diet was advanced and he tolerated well. He began having spontaneous bowel movements. He did have some blood in his stool early on. Baseline chronic anemia, presumed iron deficiency - on top developed acute blood loss anemia and was transfused with PRBCs 02/21/21 with appropriate response. His bleeding stopped and stools became green/brown. On 5/9, he continued to do well without any further bleeding. He was comfortable with and stable for discharge home     Allergies as of 02/22/2021      Reactions   Grass Extracts [gramineae Pollens]    Other reaction(s): Cough   Grass Pollen(k-o-r-t-swt Vern) Cough      Medication List    STOP taking these medications   atorvastatin 40 MG tablet Commonly known as: LIPITOR   neomycin 500 MG tablet Commonly known as: MYCIFRADIN   omeprazole 20 MG capsule Commonly known as: PRILOSEC     TAKE these medications   acetaminophen 500 MG tablet Commonly known as: TYLENOL Take 500-1,000 mg by mouth every 6 (six) hours as needed (pain).   amLODipine 10 MG tablet Commonly known as: NORVASC Take 10 mg by mouth at bedtime.   aspirin EC 81 MG tablet Take 81 mg by mouth in the morning.  Swallow whole.   cholecalciferol 25 MCG (1000 UNIT) tablet Commonly known as: VITAMIN D Take 2,000 Units by mouth in the morning.   clobetasol cream 0.05 % Commonly known as: TEMOVATE Apply 1 application topically 2 (two) times daily as needed (eczema).   ferrous sulfate 325 (65 FE) MG tablet Take 325 mg by mouth in the morning.   fluticasone 50 MCG/ACT nasal spray Commonly known as: FLONASE Place 1 spray into both nostrils daily as needed for allergies.   gabapentin 600 MG tablet Commonly known as: NEURONTIN Take 1,200 mg by mouth at bedtime.   loratadine 10 MG tablet Commonly known as: CLARITIN Take 10 mg by mouth daily as needed for allergies.   multivitamin with minerals Tabs tablet Take 1 tablet by mouth in the morning. CENTRUM FOR MEN 50+   spironolactone 25 MG tablet Commonly known as: ALDACTONE Take 25 mg by mouth at bedtime.   traMADol 50 MG tablet Commonly known as: Ultram Take 1 tablet (50 mg total) by mouth every 6 (six) hours as needed for up to 5 days (postop pain not controlled with tylenol).   triamcinolone cream 0.1 % Commonly known as: KENALOG Apply 1 application topically 2 (two) times daily as needed (eczema).   valsartan 160 MG tablet Commonly known as: DIOVAN Take 160 mg by mouth daily. TAKES IN THE AM         Follow-up Information    Ileana Roup, MD Follow up in 3 week(s).   Specialties: General Surgery, Colon and Rectal Surgery Contact information: [place]  [place] [address] [phone]              Karsen Fellows, MD [facility] Use [email or web address] to contact on call provider

## 2021-02-22 NOTE — Progress Notes (Signed)
Subjective No acute events. Feeling quite well. At 3 meals yesterday, drinking plenty of fluids. Denies nausea or vomiting. Pain controlled well. Ambulating. Having solid greenish brown BMs - denies any further blood in stool.  Objective: Vital signs in last 24 hours: Temp:  [97.8 F (36.6 C)-98.4 F (36.9 C)] 98.2 F (36.8 C) (05/09 0527) Pulse Rate:  [74-79] 79 (05/09 0527) Resp:  [15-16] 16 (05/09 0527) BP: (100-128)/(67-76) 128/71 (05/09 0527) SpO2:  [98 %-100 %] 99 % (05/09 0527) Weight:  [94.8 kg] 94.8 kg (05/09 0500) Last BM Date: 02/22/21  Intake/Output from previous day: 05/08 0701 - 05/09 0700 In: 1742.7 [P.O.:830; I.V.:408.7; Blood:504] Out: 550 [Urine:550] Intake/Output this shift: No intake/output data recorded.  Gen: NAD, comfortable CV: RRR Pulm: Normal work of breathing Abd: Soft, nontender, nondistended; incisions c/d/i without erythema or drainage Ext: SCDs in place  Lab Results: CBC  Recent Labs    02/21/21 0519 02/22/21 0424  WBC 8.6 10.0  HGB 7.3* 10.3*  HCT 23.7* 31.8*  PLT 369 389   BMET Recent Labs    02/21/21 0519 02/22/21 0424  NA 136 135  K 4.0 4.1  CL 110 107  CO2 19* 21*  GLUCOSE 94 107*  BUN 23 17  CREATININE 1.42* 1.12  CALCIUM 9.0 9.4   PT/INR No results for input(s): LABPROT, INR in the last 72 hours. ABG No results for input(s): PHART, HCO3 in the last 72 hours.  Invalid input(s): PCO2, PO2  Studies/Results:  Anti-infectives: Anti-infectives (From admission, onward)   Start     Dose/Rate Route Frequency Ordered Stop   02/19/21 1400  neomycin (MYCIFRADIN) tablet 1,000 mg  Status:  Discontinued       "And" Linked Group Details   1,000 mg Oral 3 times per day 02/19/21 0933 02/19/21 1709   02/19/21 1400  metroNIDAZOLE (FLAGYL) tablet 1,000 mg  Status:  Discontinued       "And" Linked Group Details   1,000 mg Oral 3 times per day 02/19/21 0933 02/19/21 1709   02/19/21 0945  cefoTEtan (CEFOTAN) 2 g in sodium chloride  0.9 % 100 mL IVPB        2 g 200 mL/hr over 30 Minutes Intravenous On call to O.R. 02/19/21 0933 02/19/21 1202       Assessment/Plan: Patient Active Problem List   Diagnosis Date Noted  . Colon cancer ([place]) 02/19/2021  . Iron deficiency anemia due to chronic blood loss 12/22/2020  . Abnormal colonoscopy 12/22/2020  . History of colonic polyps 12/22/2020  . 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 Adenomatous polyp of ascending colon 12/22/2020   s/p Procedure(s): ROBOTIC EXTENDED RIGHT HEMI COLECTOMY TAKEDOWN OF SPLENIC FLEXURE AND  INTRAOPERATIVE ASSESSMENT OF PERFUSION WITH BILATERAL TAP BLOCK 02/19/2021  -He is doing well - acute blood loss anemia on top of chronic iron deficiency anemia - received transfusion yesterday with appropriate response. No dizziness; fatigue much better as well -Tolerating diet, having bms and passing lots of flatus. No blood in stool. Ambulating well on his own -He has requested to go home today and is stable for discharge. -We have reviewed things to watch out for -Pathology pending -Follow-up arranged   LOS: 3 days   Nadeen Landau, MD [facility] Use [email or web address] to contact on call provider

## 2021-02-22 NOTE — Care Management Important Message (Signed)
Medicare IM printed for Social Work team to give to the patient. 

## 2021-02-26 LAB — SURGICAL PATHOLOGY

## 2021-03-03 ENCOUNTER — Other Ambulatory Visit: Payer: Self-pay

## 2021-03-03 NOTE — Progress Notes (Signed)
The proposed treatment discussed in conference is for discussion purposes only and is not a binding recommendation.  The patients have not been physically examined, or presented with their treatment options.  Therefore, final treatment plans cannot be decided.   

## 2021-03-16 ENCOUNTER — Other Ambulatory Visit: Payer: Self-pay | Admitting: Surgery

## 2021-03-16 DIAGNOSIS — C189 Malignant neoplasm of colon, unspecified: Secondary | ICD-10-CM

## 2021-03-27 IMAGING — CT CT CHEST W/ CM
2 of 6 series · 14 of 36 positions shown, 18 images · IV contrast (omnipaque)
Comparison: Report only from outside CT performed 11/25/2020 at
[REDACTED].

CLINICAL DATA: Left lower lobe pulmonary nodules on outside
abdominal CT performed for colon cancer.

EXAM:
CT CHEST WITH CONTRAST
TECHNIQUE: Multidetector CT imaging of the chest was performed during
intravenous contrast administration.
CONTRAST:  75mL OMNIPAQUE IOHEXOL 300 MG/ML  SOLN

[Series 3: routine chest with · axial · 0.69mm/px · z∈[+1386,+1600]mm · 13 of 119 slices shown, 17 images]
[im 6/119  mediastinal]
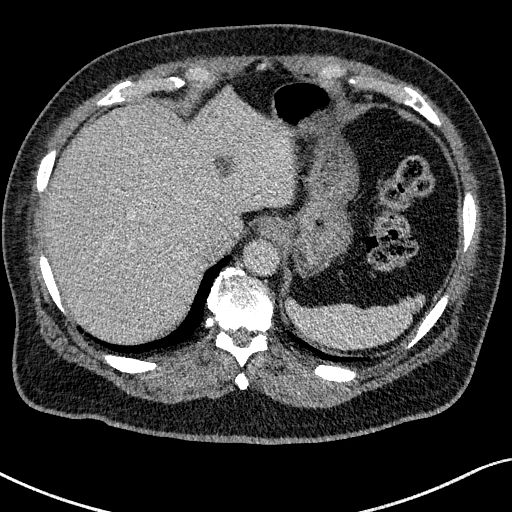
[im 6/119  lung]
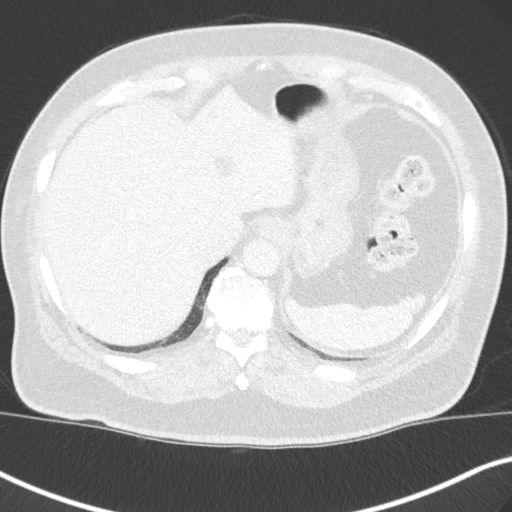
[im 18/119  lung]
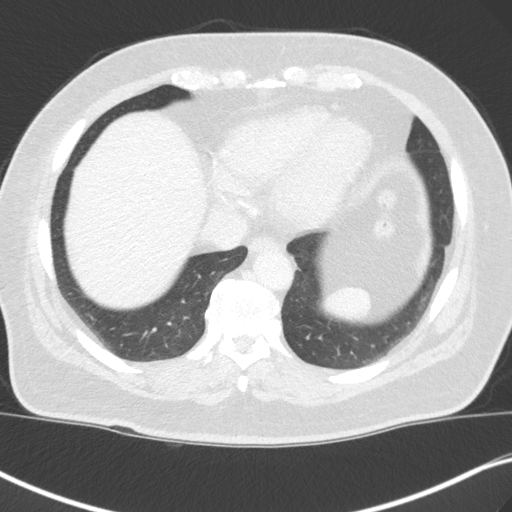
[im 24/119  lung]
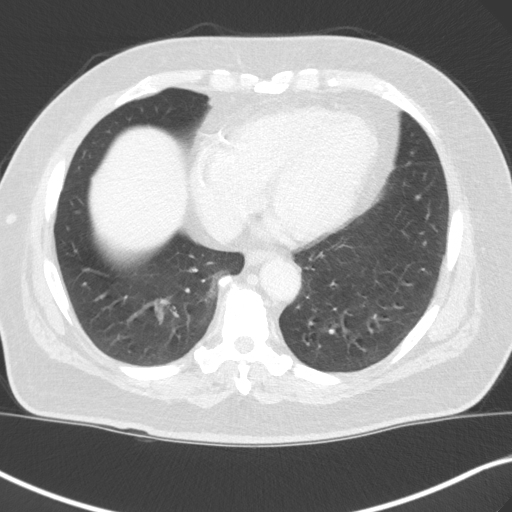
[im 36/119  lung]
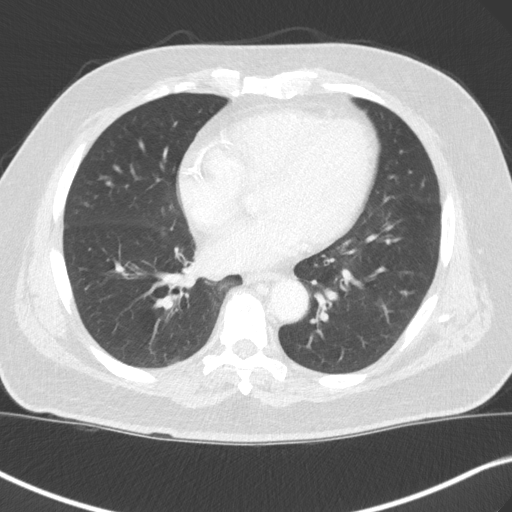
[im 42/119  mediastinal]
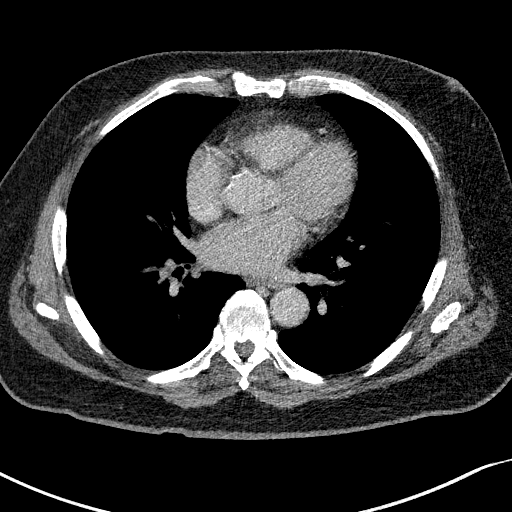
[im 42/119  lung]
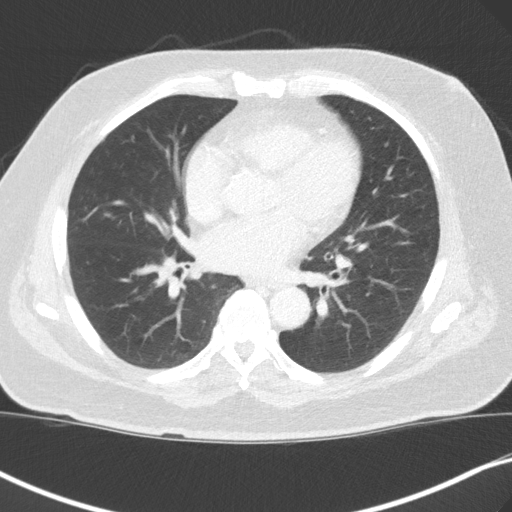
[im 54/119  lung]
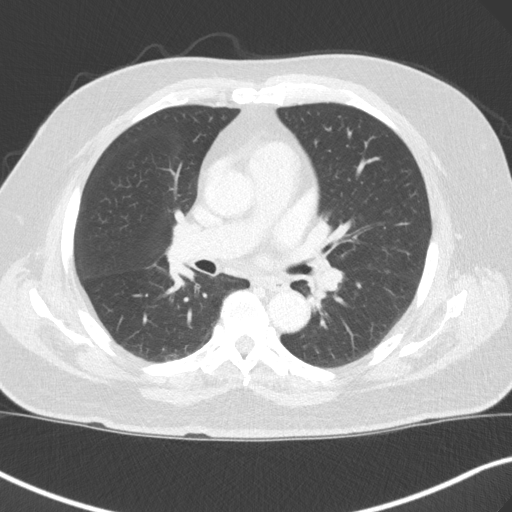
[im 60/119  lung]
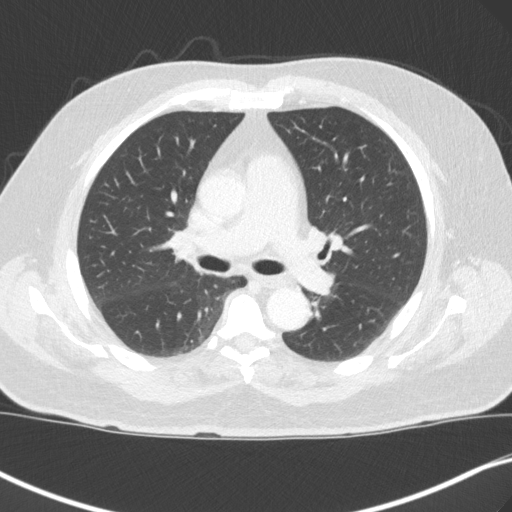
[im 65/119  lung]
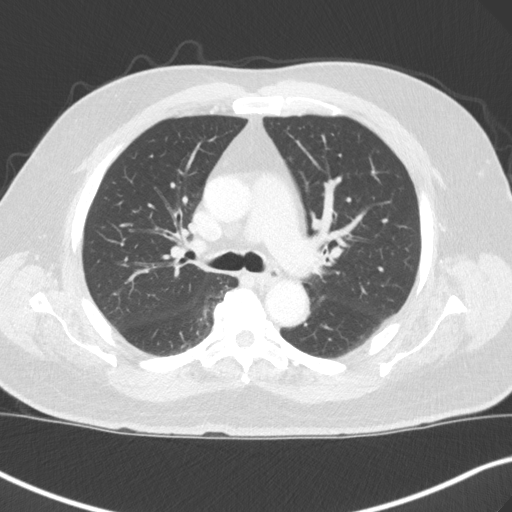
[im 77/119  mediastinal]
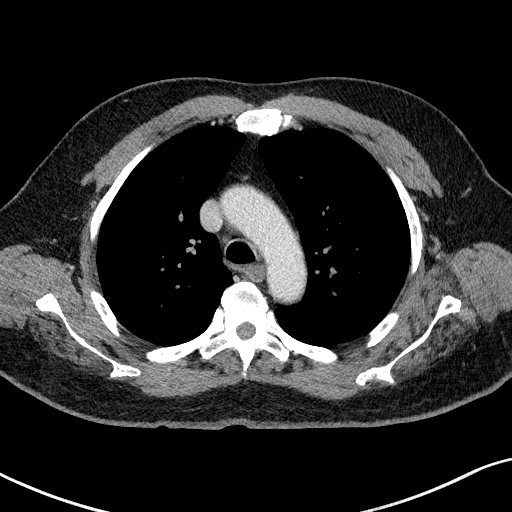
[im 77/119  lung]
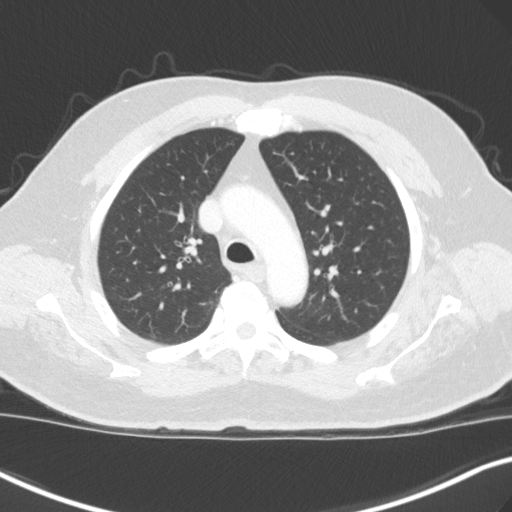
[im 83/119  lung]
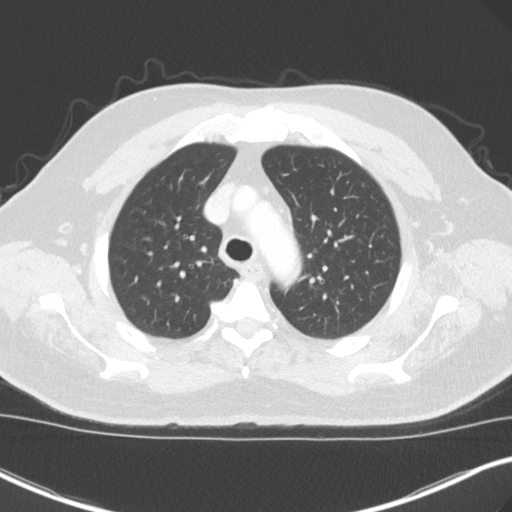
[im 95/119  lung]
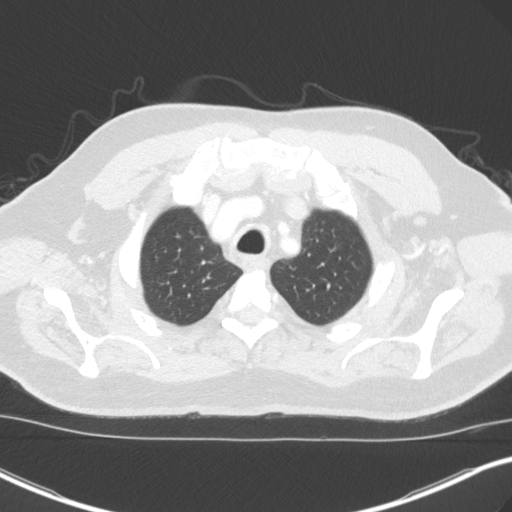
[im 101/119  lung]
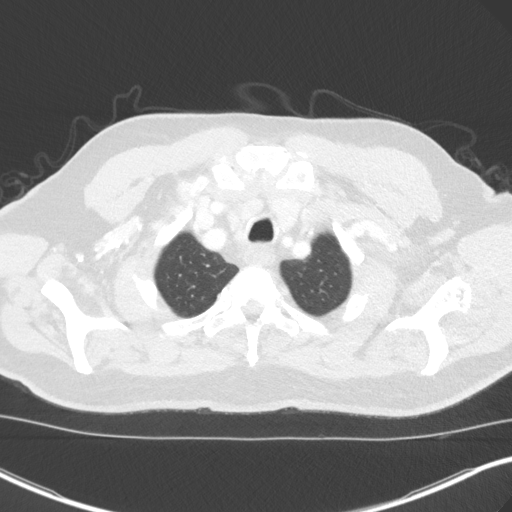
[im 113/119  mediastinal]
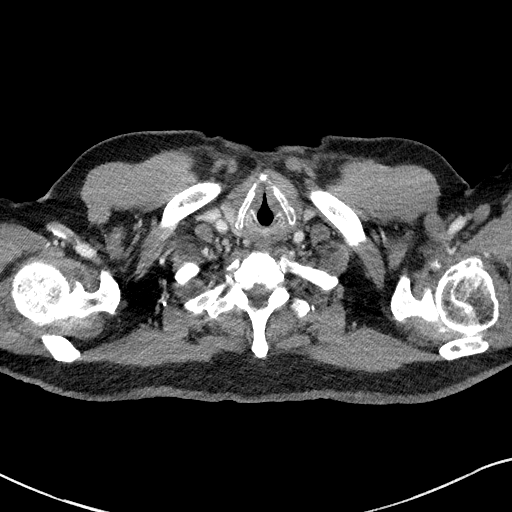
[im 113/119  lung]
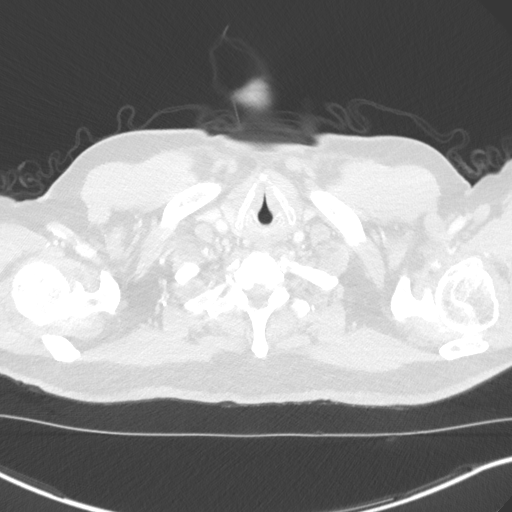

[Series 6: coronal · coronal · 0.48mm/px · 1 of 134 slices shown]
[im 67/134  lung]
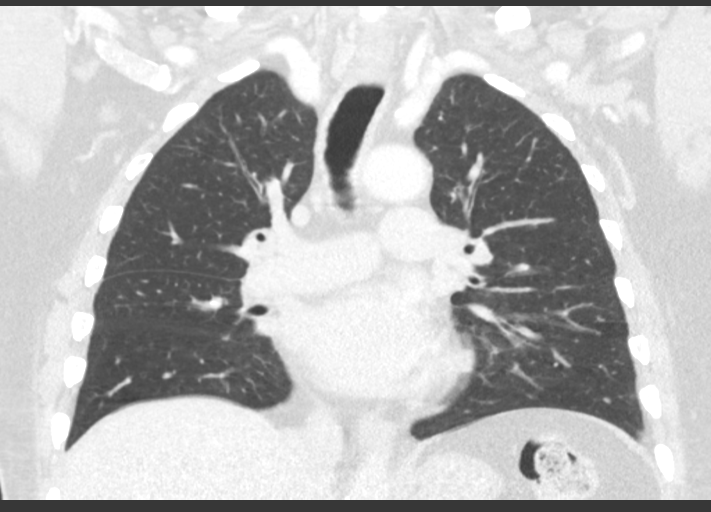

[14 of 36 positions shown; findings below may reference images not displayed]

FINDINGS: Cardiovascular: No acute vascular findings are seen. There are
extensive three-vessel coronary artery calcifications. There is
minimal atherosclerosis of the aorta and great vessels. There is
mild central enlargement of the pulmonary arteries. No evidence of
acute pulmonary embolism. The heart size is normal. There is no
pericardial effusion.

Mediastinum/Nodes: There are no enlarged mediastinal, hilar or
axillary lymph nodes. The thyroid gland, trachea and esophagus
demonstrate no significant findings.

Lungs/Pleura: There is no pleural effusion or pneumothorax. 5 mm
left lower lobe pulmonary nodule is noted on image 91/5. There is a
perifissural nodule along the left major fissure which measures 6 x
3 mm on image 67/5. There are additional tiny nodules bilaterally.
No endobronchial lesion or airspace disease.

Upper abdomen: No suspicious findings are seen within the visualized
upper abdomen. There are small low-density hepatic lesions
consistent with cysts.

Musculoskeletal/Chest wall: There is no chest wall mass or
suspicious osseous finding. Moderately advanced glenohumeral
degenerative changes with rotator cuff muscular atrophy bilaterally.
IMPRESSION: 1. Small bilateral pulmonary nodules, including a perifissural
nodule along the left major fissure. These nodules are likely
benign, but given the patient's colon cancer history, recommend
follow-up CT at 3-6 months. Fleischner criteria do not apply.
2. No acute chest findings.
3. Three-vessel coronary artery atherosclerosis. Central enlargement
of the pulmonary arteries suggesting pulmonary arterial
hypertension.

## 2021-04-05 ENCOUNTER — Ambulatory Visit
Admission: RE | Admit: 2021-04-05 | Discharge: 2021-04-05 | Disposition: A | Discharge status: Home / Self Care | Payer: Federal, State, Local not specified - PPO | Source: Ambulatory Visit | Attending: Surgery | Admitting: Surgery

## 2021-04-05 ENCOUNTER — Other Ambulatory Visit: Payer: Self-pay

## 2021-04-05 DIAGNOSIS — C189 Malignant neoplasm of colon, unspecified: Secondary | ICD-10-CM

## 2021-10-12 ENCOUNTER — Other Ambulatory Visit: Payer: Self-pay | Admitting: Surgery

## 2021-10-12 DIAGNOSIS — Z85038 Personal history of other malignant neoplasm of large intestine: Secondary | ICD-10-CM

## 2021-10-12 DIAGNOSIS — C20 Malignant neoplasm of rectum: Secondary | ICD-10-CM

## 2022-01-05 ENCOUNTER — Encounter: Payer: Self-pay | Admitting: Gastroenterology

## 2022-03-29 ENCOUNTER — Ambulatory Visit: Payer: Federal, State, Local not specified - PPO | Admitting: Gastroenterology

## 2022-04-18 ENCOUNTER — Encounter: Payer: Self-pay | Admitting: Physician Assistant

## 2022-04-18 ENCOUNTER — Other Ambulatory Visit (INDEPENDENT_AMBULATORY_CARE_PROVIDER_SITE_OTHER): Payer: Federal, State, Local not specified - PPO

## 2022-04-18 ENCOUNTER — Ambulatory Visit (INDEPENDENT_AMBULATORY_CARE_PROVIDER_SITE_OTHER): Payer: Federal, State, Local not specified - PPO | Admitting: Physician Assistant

## 2022-04-18 VITALS — BP 110/62 | HR 76 | Ht 66.0 in | Wt 213.0 lb

## 2022-04-18 DIAGNOSIS — Z85038 Personal history of other malignant neoplasm of large intestine: Secondary | ICD-10-CM

## 2022-04-18 DIAGNOSIS — Z8619 Personal history of other infectious and parasitic diseases: Secondary | ICD-10-CM

## 2022-04-18 DIAGNOSIS — Z862 Personal history of diseases of the blood and blood-forming organs and certain disorders involving the immune mechanism: Secondary | ICD-10-CM

## 2022-04-18 LAB — CBC WITH DIFFERENTIAL/PLATELET
Basophils Absolute: 0.1 10*3/uL (ref 0.0–0.1)
Basophils Relative: 1 % (ref 0.0–3.0)
Eosinophils Absolute: 1.5 10*3/uL — ABNORMAL HIGH (ref 0.0–0.7)
Eosinophils Relative: 19.8 % — ABNORMAL HIGH (ref 0.0–5.0)
HCT: 32.2 % — ABNORMAL LOW (ref 39.0–52.0)
Hemoglobin: 11 g/dL — ABNORMAL LOW (ref 13.0–17.0)
Lymphocytes Relative: 16.2 % (ref 12.0–46.0)
Lymphs Abs: 1.3 10*3/uL (ref 0.7–4.0)
MCHC: 34.3 g/dL (ref 30.0–36.0)
MCV: 92.1 fl (ref 78.0–100.0)
Monocytes Absolute: 0.7 10*3/uL (ref 0.1–1.0)
Monocytes Relative: 9.3 % (ref 3.0–12.0)
Neutro Abs: 4.2 10*3/uL (ref 1.4–7.7)
Neutrophils Relative %: 53.7 % (ref 43.0–77.0)
Platelets: 345 10*3/uL (ref 150.0–400.0)
RBC: 3.49 Mil/uL — ABNORMAL LOW (ref 4.22–5.81)
RDW: 14.1 % (ref 11.5–15.5)
WBC: 7.8 10*3/uL (ref 4.0–10.5)

## 2022-04-18 LAB — IBC + FERRITIN
Ferritin: 122 ng/mL (ref 22.0–322.0)
Iron: 59 ug/dL (ref 42–165)
Saturation Ratios: 20.2 % (ref 20.0–50.0)
TIBC: 292.6 ug/dL (ref 250.0–450.0)
Transferrin: 209 mg/dL — ABNORMAL LOW (ref 212.0–360.0)

## 2022-04-18 MED ORDER — PLENVU 140 G PO SOLR: 1.0000 | ORAL | 0 refills | Status: DC

## 2022-04-18 MED ORDER — ONDANSETRON HCL 4 MG PO TABS: ORAL_TABLET | ORAL | 0 refills | Status: AC

## 2022-04-18 NOTE — Patient Instructions (Addendum)
If you are age 78 or older, your body mass index should be between 23-30. Your Body mass index is 34.38 kg/m. If this is out of the aforementioned range listed, please consider follow up with your Primary Care Provider. ________________________________________________________  The [facility] providers would like to encourage you to use [facility] to communicate with providers for non-urgent requests or questions.  Due to long hold times on the telephone, sending your provider a message by [facility] may be a faster and more efficient way to get a response.  Please allow 48 business hours for a response.  Please remember that this is for non-urgent requests.  _______________________________________________________  Dennis Bast have been scheduled for a colonoscopy. Please follow written instructions given to you at your visit today.  Please pick up your prep supplies at the pharmacy within the next 1-3 days. If you use inhalers (even only as needed), please bring them with you on the day of your procedure.  Your provider has requested that you go to the basement level for lab work before leaving today. Press "B" on the elevator. The lab is located at the first door on the left as you exit the elevator.  We have sent Zofran for you to take with your prep. Take 1 tablet 30 minutes to an hour prior to taking prep.  Thank you for entrusting me with your care and choosing [facility].  Amy Esterwood, PA-C

## 2022-04-18 NOTE — Progress Notes (Signed)
Subjective:    Patient ID: Richard Silva, male    DOB: 01-Jun-1946, 76 y.o.   MRN: [id number]  HPI Richard Silva is a pleasant 76 year old African-American male, established with Dr. Rush Landmark who is referred back today by Dr. Armond Hang surgery for follow-up colonoscopy. Patient initially had undergone colonoscopy in early 2022 in [place] with finding of a malignant neoplasm of the splenic flexure, and multiple polyps in the right colon including 1 polypoid mass that returned as adenomatous tissue. He subsequently underwent colonoscopy and EGD per Dr. Rush Landmark  in March 2022.  He had 122 mm polyp in the proximal ascending colon which was removed with mucosal resection, 1 greater than 50 mm polyp in the mid ascending colon removed with mucosal resection, clips placed and tattooed, he had 58 other polyps in the descending transverse and ascending colon all 2 to 15 mm in size removed and retrieved and a partially obstructing malignant tumor at the splenic flexure and in the distal transverse colon which was tattooed.  Also noted to have diverticulosis in the left colon and nonbleeding hemorrhoids.  He also had EGD done at that same time because of chronic anemia and this was unremarkable other than mild gastritis.  Biopsies were positive for H. pylori and he was treated.  Path showed an invasive moderately differentiated adenocarcinoma of the ascending colon arising in a tubular adenoma with high-grade dysplasia focally suspicious for lymphovascular invasion, multiple fragments of tubular adenomas (EMR 2, and multiple polypectomies with multiple fragments of tubular adenomas with foci of high-grade glandular dysplasia no malignancy.Marland Kitchen  He underwent robotic assisted extended right hemicolectomy with takedown of the splenic flexure, ileal descending anastomosis on 02/19/2021, after CT of the abdomen pelvis showed no evidence of metastatic disease, CT of the chest showed small nodules favored to be  benign. Path showed a moderately differentiated adenocarcinoma positive PNI no L VI; PT3N0  He did have follow-up CT of the abdomen and pelvis January 2023 with no evidence of recurrent or metastatic disease And CT of the chest had been repeated in June 2022 showed unchanged small nodules in the left chest likely benign.  Patient says he has been doing well recently has no complaints of abdominal pain, bowel habits are normal, he has not noted any melena or hematochezia.  Appetite has been fine, no problems with nausea or vomiting. He is taking an iron supplement daily.  He said he had a difficult time with both of the bowel preps for the prior colonoscopies but did better with the liquid prep then [place]   Review of Systems Pertinent positive and negative review of systems were noted in the above HPI section.  All other review of systems was otherwise negative.   Outpatient Encounter Medications as of 04/18/2022  Medication Sig   acetaminophen (TYLENOL) 500 MG tablet Take 500-1,000 mg by mouth every 6 (six) hours as needed (pain).   amLODipine (NORVASC) 10 MG tablet Take 10 mg by mouth at bedtime.   aspirin EC 81 MG tablet Take 81 mg by mouth in the morning. Swallow whole.   cholecalciferol (VITAMIN D) 25 MCG (1000 UNIT) tablet Take 2,000 Units by mouth in the morning.   clobetasol cream (TEMOVATE) 8.11 % Apply 1 application topically 2 (two) times daily as needed (eczema).   DUPIXENT 300 MG/2ML SOPN Inject into the skin every 14 (fourteen) days.   ferrous sulfate 325 (65 FE) MG tablet Take 325 mg by mouth in  the morning.   fluticasone (FLONASE) 50 MCG/ACT nasal spray Place 1 spray into both nostrils daily as needed for allergies.   gabapentin (NEURONTIN) 600 MG tablet Take 1,200 mg by mouth at bedtime.   loratadine (CLARITIN) 10 MG tablet Take 10 mg by mouth daily as needed for allergies.   Multiple Vitamin (MULTIVITAMIN WITH MINERALS) TABS  tablet Take 1 tablet by mouth in the morning. CENTRUM FOR MEN 50+   ondansetron (ZOFRAN) 4 MG tablet Take 1 tablet 1 hour- 30 minutes prior to colon prep   PEG-KCl-NaCl-NaSulf-Na Asc-C (PLENVU) 140 g SOLR Take 1 kit by mouth as directed.   spironolactone (ALDACTONE) 25 MG tablet Take 25 mg by mouth at bedtime.   triamcinolone cream (KENALOG) 0.1 % Apply 1 application topically 2 (two) times daily as needed (eczema).   valsartan (DIOVAN) 160 MG tablet Take 160 mg by mouth daily. TAKES IN THE AM   No facility-administered encounter medications on file as of 04/18/2022.   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[place]) 12/22/2020   Adenomatous polyp of ascending colon 12/22/2020   Social History   Socioeconomic History   Marital status: Widowed    Spouse name: Not on file   Number of children: Not on file   Years of education: Not on file   Highest education level: Not on file  Occupational History   Not on file  Tobacco Use   Smoking status: Former   Smokeless tobacco: Current    Types: Chew   Tobacco comments:    quit 2012  Vaping Use   Vaping Use: Never used  Substance and Sexual Activity   Alcohol use: Yes    Comment: occasional   Drug use: Never   Sexual activity: Not Currently  Other Topics Concern   Not on file  Social History Narrative   Not on file   Social Determinants of Health   Financial Resource Strain: Not on file  Food Insecurity: Not on file  Transportation Needs: Not on file  Physical Activity: Not on file  Stress: Not on file  Social Connections: Not on file  Intimate Partner Violence: Not on file     Richard Silva family history includes Colon cancer in his mother; Diabetes in his brother.      Objective:    Vitals:   04/18/22 1056  BP: 110/62  Pulse: 76    Physical Exam Well-developed well-nourished elderly African-American male in no acute distress.  Accompanied by 2 daughters,  Weight, 213 BMI 34.3  HEENT; nontraumatic normocephalic, EOMI, PE R LA, sclera anicteric. Oropharynx; not examined today Neck; supple, no JVD Cardiovascular; regular rate and rhythm with S1-S2, no murmur rub or gallop Pulmonary; Clear bilaterally Abdomen; soft, nontender, nondistended, no palpable mass or hepatosplenomegaly, bowel sounds are active Rectal; not done today  Skin; benign exam, no jaundice rash or appreciable lesions Extremities; no clubbing cyanosis or edema skin warm and dry Neuro/Psych; alert and oriented x4, grossly nonfocal mood and affect appropriate        Assessment & Plan:   #58 76 year old African-American male with adenocarcinoma of the ascending colon, diagnosed March 2022,  status post robotic assisted extended right hemicolectomy, takedown of the splenic flexure and ileal descending anastomosis May 2022/Dr. White. Stage II-PT3N0, positive PNI no LVI  #2 patient had multiple other tubular adenomatous polyps removed at the time of colonoscopy March 2022, including a 22 mm polyp in the proximal ascending colon and a 50 mm polyp in the mid ascending colon.  #3 history of iron deficiency anemia felt secondary to above #4.  Hypertension #5.  Hyperlipidemia #6.  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 Rush Landmark .  Procedure was discussed in detail with the patient including indications risk and benefits and he is agreeable to proceed.  He has not done testing to prove eradication of H. pylori, will check H. pylori stool antigen Check CBC and iron studies  today Have sent prescription for Zofran 4 mg every 6 hours for him to use as needed with the bowel prep.  Richard Flakes S Lissie Hinesley PA-C 04/18/2022   Cc: [facility]*

## 2022-04-20 NOTE — Progress Notes (Signed)
Attending Physician's Attestation   I have reviewed the chart.   I agree with the Advanced Practitioner's note, impression, and recommendations with any updates as below.    Simrit Gohlke Mansouraty, MD [facility] Office # [phone]  

## 2022-05-04 ENCOUNTER — Encounter: Payer: Self-pay | Admitting: Gastroenterology

## 2022-05-04 ENCOUNTER — Other Ambulatory Visit: Payer: Federal, State, Local not specified - PPO

## 2022-05-04 ENCOUNTER — Ambulatory Visit (AMBULATORY_SURGERY_CENTER): Payer: Federal, State, Local not specified - PPO | Admitting: Gastroenterology

## 2022-05-04 VITALS — BP 119/69 | HR 59 | Temp 98.4°F | Resp 14 | Ht 66.0 in | Wt 213.0 lb

## 2022-05-04 DIAGNOSIS — Z08 Encounter for follow-up examination after completed treatment for malignant neoplasm: Secondary | ICD-10-CM | POA: Diagnosis not present

## 2022-05-04 DIAGNOSIS — Z85038 Personal history of other malignant neoplasm of large intestine: Secondary | ICD-10-CM | POA: Diagnosis not present

## 2022-05-04 DIAGNOSIS — T184XXA Foreign body in colon, initial encounter: Secondary | ICD-10-CM | POA: Diagnosis not present

## 2022-05-04 DIAGNOSIS — Z86010 Personal history of colonic polyps: Secondary | ICD-10-CM | POA: Diagnosis not present

## 2022-05-04 DIAGNOSIS — K635 Polyp of colon: Secondary | ICD-10-CM | POA: Diagnosis not present

## 2022-05-04 DIAGNOSIS — Z8619 Personal history of other infectious and parasitic diseases: Secondary | ICD-10-CM

## 2022-05-04 DIAGNOSIS — Z09 Encounter for follow-up examination after completed treatment for conditions other than malignant neoplasm: Secondary | ICD-10-CM

## 2022-05-04 DIAGNOSIS — D125 Benign neoplasm of sigmoid colon: Secondary | ICD-10-CM

## 2022-05-04 DIAGNOSIS — D509 Iron deficiency anemia, unspecified: Secondary | ICD-10-CM

## 2022-05-04 MED ORDER — SODIUM CHLORIDE 0.9 % IV SOLN: 500.0000 mL | Freq: Once | INTRAVENOUS | Status: DC

## 2022-05-04 NOTE — Patient Instructions (Signed)
Please read handouts provided. Continue present medications. Await pathology results. High Fiber Diet. Use FiberCon 1-2 tablets daily. Repeat colonoscopy in 3 years for screening.   YOU HAD AN ENDOSCOPIC PROCEDURE TODAY AT [facility]:   Refer to the procedure report that was given to you for any specific questions about what was found during the examination.  If the procedure report does not answer your questions, please call your gastroenterologist to clarify.  If you requested that your care partner not be given the details of your procedure findings, then the procedure report has been included in a sealed envelope for you to review at your convenience later.  YOU SHOULD EXPECT: Some feelings of bloating in the abdomen. Passage of more gas than usual.  Walking can help get rid of the air that was put into your GI tract during the procedure and reduce the bloating. If you had a lower endoscopy (such as a colonoscopy or flexible sigmoidoscopy) you may notice spotting of blood in your stool or on the toilet paper. If you underwent a bowel prep for your procedure, you may not have a normal bowel movement for a few days.  Please Note:  You might notice some irritation and congestion in your nose or some drainage.  This is from the oxygen used during your procedure.  There is no need for concern and it should clear up in a day or so.  SYMPTOMS TO REPORT IMMEDIATELY:  Following lower endoscopy (colonoscopy or flexible sigmoidoscopy):  Excessive amounts of blood in the stool  Significant tenderness or worsening of abdominal pains  Swelling of the abdomen that is new, acute  Fever of 100F or higher   For urgent or emergent issues, a gastroenterologist can be reached at any hour by calling [phone]. Do not use MyChart messaging for urgent concerns.    DIET:  We do recommend a small meal at first, but then you may proceed to your regular diet.  Drink plenty of fluids but  you should avoid alcoholic beverages for 24 hours.  ACTIVITY:  You should plan to take it easy for the rest of today and you should NOT DRIVE or use heavy machinery until tomorrow (because of the sedation medicines used during the test).    FOLLOW UP: Our staff will call the number listed on your records the next business day following your procedure.  We will call around 7:15- 8:00 am to check on you and address any questions or concerns that you may have regarding the information given to you following your procedure. If we do not reach you, we will leave a message.  If you develop any symptoms (ie: fever, flu-like symptoms, shortness of breath, cough etc.) before then, please call [phone].  If you test positive for Covid 19 in the 2 weeks post procedure, please call and report this information to [place].    If any biopsies were taken you will be contacted by phone or by letter within the next 1-3 weeks.  Please call us at [phone] if you have not heard about the biopsies in 3 weeks.    SIGNATURES/CONFIDENTIALITY: You and/or your care partner have signed paperwork which will be entered into your electronic medical record.  These signatures attest to the fact that that the information above on your After Visit Summary has been reviewed and is understood.  Full responsibility of the confidentiality of this discharge information lies with you and/or your care-partner.

## 2022-05-04 NOTE — Op Note (Signed)
[facility] Patient Name: Richard Silva Procedure Date: 05/04/2022 2:38 PM MRN: [id number] Endoscopist: Justice Britain , MD Age: 76 Referring MD:  Date of Birth: [birth year] Gender: Male Account #: [id number] Procedure:                Colonoscopy Indications:              High risk colon cancer surveillance: Personal                            history of colonic polyps, High risk colon cancer                            surveillance: Personal history of colon cancer Medicines:                Monitored Anesthesia Care Procedure:                Pre-Anesthesia Assessment:                           - Prior to the procedure, a History and Physical                            was performed, and patient medications and                            allergies were reviewed. The patient's tolerance of                            previous anesthesia was also reviewed. The risks                            and benefits of the procedure and the sedation                            options and risks were discussed with the patient.                            All questions were answered, and informed consent                            was obtained. Prior Anticoagulants: The patient has                            taken no previous anticoagulant or antiplatelet                            agents except for aspirin. ASA Grade Assessment: II                            - A patient with mild systemic disease. After                            reviewing the risks and benefits, the patient was  deemed in satisfactory condition to undergo the                            procedure.                           After obtaining informed consent, the colonoscope                            was passed under direct vision. Throughout the                            procedure, the patient's blood pressure, pulse, and                            oxygen saturations were monitored continuously.  The                            Olympus CF-HQ190L [id number]) Colonoscope was                            introduced through the anus and advanced to the the                            ileocolonic anastomosis. The colonoscopy was                            performed without difficulty. The patient tolerated                            the procedure. The quality of the bowel preparation                            was good. Scope In: 6:00:45 PM Scope Out: 2:56:54 PM Scope Withdrawal Time: 0 hours 8 minutes 52 seconds  Total Procedure Duration: 0 hours 10 minutes 43 seconds  Findings:                 The digital rectal exam findings include                            hemorrhoids. Pertinent negatives include no                            palpable rectal lesions.                           There was evidence of a prior functional end-to-end                            ileo-colonic anastomosis in the descending colon.                            This was patent and was characterized by healthy  appearing mucosa and a disrupted staple line. The                            anastomosis was traversed. Removal of a staple was                            accomplished with a regular forceps.                           Two sessile polyps were found in the sigmoid colon.                            The polyps were 2 to 3 mm in size. These polyps                            were removed with a cold snare. Resection and                            retrieval were complete.                           Normal mucosa was found in the entire colon                            otherwise.                           Non-bleeding non-thrombosed internal hemorrhoids                            were found during retroflexion, during perianal                            exam and during digital exam. The hemorrhoids were                            Grade II (internal hemorrhoids that prolapse but                             reduce spontaneously). Complications:            No immediate complications. Estimated Blood Loss:     Estimated blood loss was minimal. Impression:               - Hemorrhoids found on digital rectal exam.                           - Patent functional end-to-end ileo-colonic                            anastomosis, characterized by healthy appearing                            mucosa and a disrupted staple line.                           -  Two 2 to 3 mm polyps in the sigmoid colon,                            removed with a cold snare. Resected and retrieved.                           - Normal mucosa in the entire examined colon                            otherwise.                           - Non-bleeding non-thrombosed internal hemorrhoids. Recommendation:           - The patient will be observed post-procedure,                            until all discharge criteria are met.                           - Discharge patient to home.                           - Patient has a contact number available for                            emergencies. The signs and symptoms of potential                            delayed complications were discussed with the                            patient. Return to normal activities tomorrow.                            Written discharge instructions were provided to the                            patient.                           - High fiber diet.                           - Use FiberCon 1-2 tablets PO daily.                           - Continue present medications.                           - Await pathology results.                           - Repeat colonoscopy in 3 years for surveillance no  matter pathology due to history of colon cancer.                           - The findings and recommendations were discussed                            with the patient.                           - The findings and recommendations  were discussed                            with the patient's family. Justice Britain, MD 05/04/2022 3:05:41 PM

## 2022-05-04 NOTE — Progress Notes (Signed)
To pacu, VSS. Report to [email or web address].tb 

## 2022-05-04 NOTE — Progress Notes (Signed)
GASTROENTEROLOGY PROCEDURE H&P NOTE   Primary Care Physician: Kendrick Ranch, MD  HPI: Richard Silva is a 75 y.o. male who presents for Colonoscopy for high risk surveillance in setting of previous colon cancer s/p resection in 2022.  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   Colon cancer ([employer]) 11/09/2020   Hypertension    Pre-diabetes    Thyroid disease    Past Surgical History:  Procedure Laterality Date   BIOPSY  01/11/2021   Procedure: BIOPSY;  Surgeon: [name]., MD;  Location: [facility];  Service: Gastroenterology;;   COLONOSCOPY  11/09/2020   COLONOSCOPY WITH PROPOFOL N/A 01/11/2021   Procedure: COLONOSCOPY WITH PROPOFOL;  Surgeon: [name]., MD;  Location: [facility];  Service: Gastroenterology;  Laterality: N/A;   ENDOSCOPIC MUCOSAL RESECTION N/A 01/11/2021   Procedure: ENDOSCOPIC MUCOSAL RESECTION;  Surgeon: Rush Landmark Telford Nab., MD;  Location: [facility];  Service: Gastroenterology;  Laterality: N/A;   ESOPHAGOGASTRODUODENOSCOPY (EGD) WITH PROPOFOL N/A 01/11/2021   Procedure: ESOPHAGOGASTRODUODENOSCOPY (EGD) WITH PROPOFOL;  Surgeon: Rush Landmark Telford Nab., MD;  Location: [facility];  Service: Gastroenterology;  Laterality: N/A;   HEMOSTASIS CLIP PLACEMENT  01/11/2021   Procedure: HEMOSTASIS CLIP PLACEMENT;  Surgeon: [name]., MD;  Location: [facility];  Service: Gastroenterology;;   KNEE SURGERY     POLYPECTOMY  01/11/2021   Procedure: POLYPECTOMY;  Surgeon: [name]., MD;  Location: [facility];  Service: Gastroenterology;;   SUBMUCOSAL TATTOO INJECTION  01/11/2021   Procedure: SUBMUCOSAL TATTOO INJECTION;  Surgeon: [name]., MD;  Location: [facility];  Service: Gastroenterology;;   Current Outpatient Medications  Medication Sig Dispense Refill   acetaminophen (TYLENOL) 500 MG  tablet Take 500-1,000 mg by mouth every 6 (six) hours as needed (pain).     amLODipine (NORVASC) 10 MG tablet Take 10 mg by mouth at bedtime.     aspirin EC 81 MG tablet Take 81 mg by mouth in the morning. Swallow whole.     cholecalciferol (VITAMIN D) 25 MCG (1000 UNIT) tablet Take 2,000 Units by mouth in the morning.     clobetasol cream (TEMOVATE) 5.03 % Apply 1 application topically 2 (two) times daily as needed (eczema).     DUPIXENT 300 MG/2ML SOPN Inject into the skin every 14 (fourteen) days.     ferrous sulfate 325 (65 FE) MG tablet Take 325 mg by mouth in the morning.     fluticasone (FLONASE) 50 MCG/ACT nasal spray Place 1 spray into both nostrils daily as needed for allergies.     gabapentin (NEURONTIN) 600 MG tablet Take 1,200 mg by mouth at bedtime.     loratadine (CLARITIN) 10 MG tablet Take 10 mg by mouth daily as needed for allergies.     Multiple Vitamin (MULTIVITAMIN WITH MINERALS) TABS tablet Take 1 tablet by mouth in the morning. CENTRUM FOR MEN 50+     ondansetron (ZOFRAN) 4 MG tablet Take 1 tablet 1 hour- 30 minutes prior to colon prep 2 tablet 0   PEG-KCl-NaCl-NaSulf-Na Asc-C (PLENVU) 140 g SOLR Take 1 kit by mouth as directed. 1 each 0   spironolactone (ALDACTONE) 25 MG tablet Take 25 mg by mouth at bedtime.     triamcinolone cream (KENALOG) 0.1 % Apply 1 application topically 2 (two) times daily as needed (eczema).     valsartan (DIOVAN) 160 MG tablet Take 160  mg by mouth daily. TAKES IN THE AM     No current facility-administered medications for this visit.    Current Outpatient Medications:    acetaminophen (TYLENOL) 500 MG tablet, Take 500-1,000 mg by mouth every 6 (six) hours as needed (pain)., Disp: , Rfl:    amLODipine (NORVASC) 10 MG tablet, Take 10 mg by mouth at bedtime., Disp: , Rfl:    aspirin EC 81 MG tablet, Take 81 mg by mouth in the morning. Swallow whole., Disp: , Rfl:    cholecalciferol (VITAMIN D) 25 MCG (1000 UNIT) tablet, Take 2,000 Units by mouth  in the morning., Disp: , Rfl:    clobetasol cream (TEMOVATE) 0.05 %, Apply 1 application topically 2 (two) times daily as needed (eczema)., Disp: , Rfl:    DUPIXENT 300 MG/2ML SOPN, Inject into the skin every 14 (fourteen) days., Disp: , Rfl:    ferrous sulfate 325 (65 FE) MG tablet, Take 325 mg by mouth in the morning., Disp: , Rfl:    fluticasone (FLONASE) 50 MCG/ACT nasal spray, Place 1 spray into both nostrils daily as needed for allergies., Disp: , Rfl:    gabapentin (NEURONTIN) 600 MG tablet, Take 1,200 mg by mouth at bedtime., Disp: , Rfl:    loratadine (CLARITIN) 10 MG tablet, Take 10 mg by mouth daily as needed for allergies., Disp: , Rfl:    Multiple Vitamin (MULTIVITAMIN WITH MINERALS) TABS tablet, Take 1 tablet by mouth in the morning. CENTRUM FOR MEN 50+, Disp: , Rfl:    ondansetron (ZOFRAN) 4 MG tablet, Take 1 tablet 1 hour- 30 minutes prior to colon prep, Disp: 2 tablet, Rfl: 0   PEG-KCl-NaCl-NaSulf-Na Asc-C (PLENVU) 140 g SOLR, Take 1 kit by mouth as directed., Disp: 1 each, Rfl: 0   spironolactone (ALDACTONE) 25 MG tablet, Take 25 mg by mouth at bedtime., Disp: , Rfl:    triamcinolone cream (KENALOG) 0.1 %, Apply 1 application topically 2 (two) times daily as needed (eczema)., Disp: , Rfl:    valsartan (DIOVAN) 160 MG tablet, Take 160 mg by mouth daily. TAKES IN THE AM, Disp: , Rfl:  Allergies  Allergen Reactions   Grass Extracts [Gramineae Pollens]     Other reaction(s): Cough   Grass Pollen(K-O-R-T-Swt Vern) Cough   Family History  Problem Relation Age of Onset   Colon cancer Mother        50's   Diabetes Brother    Esophageal cancer Neg Hx    Inflammatory bowel disease Neg Hx    Liver disease Neg Hx    Pancreatic cancer Neg Hx    Stomach cancer Neg Hx    Social History   Socioeconomic History   Marital status: Widowed    Spouse name: Not on file   Number of children: Not on file   Years of education: Not on file   Highest education level: Not on file   Occupational History   Not on file  Tobacco Use   Smoking status: Former   Smokeless tobacco: Current    Types: Chew   Tobacco comments:    quit 2012  Vaping Use   Vaping Use: Never used  Substance and Sexual Activity   Alcohol use: Yes    Comment: occasional   Drug use: Never   Sexual activity: Not Currently  Other Topics Concern   Not on file  Social History Narrative   Not on file   Social Determinants of Health   Financial Resource Strain: Not on file  Food   Insecurity: Not on file  Transportation Needs: Not on file  Physical Activity: Not on file  Stress: Not on file  Social Connections: Not on file  Intimate Partner Violence: Not on file    Physical Exam: There were no vitals filed for this visit. There is no height or weight on file to calculate BMI. GEN: NAD EYE: Sclerae anicteric ENT: MMM CV: Non-tachycardic GI: Soft, NT/ND NEURO:  Alert & Oriented x 3  Lab Results: No results for input(s): "WBC", "HGB", "HCT", "PLT" in the last 72 hours. BMET No results for input(s): "NA", "K", "CL", "CO2", "GLUCOSE", "BUN", "CREATININE", "CALCIUM" in the last 72 hours. LFT No results for input(s): "PROT", "ALBUMIN", "AST", "ALT", "ALKPHOS", "BILITOT", "BILIDIR", "IBILI" in the last 72 hours. PT/INR No results for input(s): "LABPROT", "INR" in the last 72 hours.   Impression / Plan: This is a 76 y.o.male who presents for Colonoscopy for high risk surveillance in setting of previous colon cancer s/p resection in 2022.  The risks and benefits of endoscopic evaluation/treatment were discussed with the patient and/or family; these include but are not limited to the risk of perforation, infection, bleeding, missed lesions, lack of diagnosis, severe illness requiring hospitalization, as well as anesthesia and sedation related illnesses.  The patient's history has been reviewed, patient examined, no change in status, and deemed stable for procedure.  The patient and/or family  is agreeable to proceed.    Justice Britain, MD [facility] Office # [phone]

## 2022-05-04 NOTE — Progress Notes (Signed)
Called to room to assist during endoscopic procedure.  Patient ID and intended procedure confirmed with present staff. Received instructions for my participation in the procedure from the performing physician.  

## 2022-05-04 NOTE — Progress Notes (Signed)
Pt's states no medical or surgical changes since previsit or office visit. 

## 2022-05-05 ENCOUNTER — Telehealth: Payer: Self-pay | Admitting: *Deleted

## 2022-05-05 NOTE — Telephone Encounter (Signed)
  Follow up Call-     05/04/2022    2:11 PM  Call back number  Post procedure Call Back phone  # [phone]  Permission to leave phone message Yes     Patient questions:  Do you have a fever, pain , or abdominal swelling? No. Pain Score  0 *  Have you tolerated food without any problems? Yes.    Have you been able to return to your normal activities? Yes.    Do you have any questions about your discharge instructions: Diet   No. Medications  No. Follow up visit  No.  Do you have questions or concerns about your Care? No.  Actions: * If pain score is 4 or above: No action needed, pain <4.

## 2022-05-06 LAB — H. PYLORI ANTIGEN, STOOL: H pylori Ag, Stl: NEGATIVE

## 2022-05-09 ENCOUNTER — Encounter: Payer: Self-pay | Admitting: Gastroenterology

## 2023-10-30 ENCOUNTER — Ambulatory Visit: Payer: Medicare Other

## 2023-10-30 ENCOUNTER — Ambulatory Visit (INDEPENDENT_AMBULATORY_CARE_PROVIDER_SITE_OTHER): Payer: Medicare Other | Admitting: Podiatry

## 2023-10-30 ENCOUNTER — Encounter: Payer: Self-pay | Admitting: Podiatry

## 2023-10-30 DIAGNOSIS — M79674 Pain in right toe(s): Secondary | ICD-10-CM

## 2023-10-30 DIAGNOSIS — M21619 Bunion of unspecified foot: Secondary | ICD-10-CM

## 2023-10-30 DIAGNOSIS — M2041 Other hammer toe(s) (acquired), right foot: Secondary | ICD-10-CM

## 2023-10-30 DIAGNOSIS — M79675 Pain in left toe(s): Secondary | ICD-10-CM | POA: Diagnosis not present

## 2023-10-30 DIAGNOSIS — E1149 Type 2 diabetes mellitus with other diabetic neurological complication: Secondary | ICD-10-CM

## 2023-10-30 DIAGNOSIS — M2042 Other hammer toe(s) (acquired), left foot: Secondary | ICD-10-CM

## 2023-10-30 DIAGNOSIS — M2141 Flat foot [pes planus] (acquired), right foot: Secondary | ICD-10-CM

## 2023-10-30 DIAGNOSIS — B351 Tinea unguium: Secondary | ICD-10-CM | POA: Diagnosis not present

## 2023-10-30 MED ORDER — GABAPENTIN 300 MG PO CAPS: 300.0000 mg | ORAL_CAPSULE | Freq: Three times a day (TID) | ORAL | 3 refills | Status: DC

## 2023-10-30 NOTE — Progress Notes (Signed)
 Patient presents to the office today for diabetic shoe and insole measuring.  Patient was measured with brannock device to determine size and width for 1 pair of extra depth shoes and foam casted for 3 pair of insoles.   Documentation of medical necessity will be sent to patient's treating diabetic doctor to verify and sign.   Patient's diabetic provider: [phone] / vasireddy   Shoes and insoles will be ordered at that time and patient will be notified for an appointment for fitting when they arrive.   Shoe size (per patient): 9.5 Brannock measurement: 9.5 Patient shoe selection- Shoe choice:   [id number] / does not want 2nd choice will wait if needed for this boot  Shoe size ordered: 10W ABN signed

## 2023-10-30 NOTE — Patient Instructions (Signed)
 Gabapentin Capsules or Tablets What is this medication? GABAPENTIN (GA ba pen tin) treats nerve pain. It may also be used to prevent and control seizures in people with epilepsy. It works by calming overactive nerves in your body. This medicine may be used for other purposes; ask your health care provider or pharmacist if you have questions. COMMON BRAND NAME(S): Active-PAC with Gabapentin, Gabarone, Gralise, Neurontin What should I tell my care team before I take this medication? They need to know if you have any of these conditions: Kidney disease Lung or breathing disease Substance use disorder Suicidal thoughts, plans, or attempt by you or a family member An unusual or allergic reaction to gabapentin, other medications, foods, dyes, or preservatives Pregnant or trying to get pregnant Breastfeeding How should I use this medication? Take this medication by mouth with a glass of water. Follow the directions on the prescription label. You can take it with or without food. If it upsets your stomach, take it with food. Take your medication at regular intervals. Do not take it more often than directed. Do not stop taking except on your care team's advice. If you are directed to break the 600 or 800 mg tablets in half as part of your dose, the extra half tablet should be used for the next dose. If you have not used the extra half tablet within 28 days, it should be thrown away. A special MedGuide will be given to you by the pharmacist with each prescription and refill. Be sure to read this information carefully each time. Talk to your care team about the use of this medication in children. While this medication may be prescribed for children as young as 3 years for selected conditions, precautions do apply. Overdosage: If you think you have taken too much of this medicine contact a poison control center or emergency room at once. NOTE: This medicine is only for you. Do not share this medicine with  others. What if I miss a dose? If you miss a dose, take it as soon as you can. If it is almost time for your next dose, take only that dose. Do not take double or extra doses. What may interact with this medication? Alcohol  Antihistamines for allergy, cough, and cold Certain medications for anxiety or sleep Certain medications for depression like amitriptyline, fluoxetine, sertraline  Certain medications for seizures like phenobarbital, primidone Certain medications for stomach problems General anesthetics like halothane, isoflurane, methoxyflurane, propofol  Local anesthetics like lidocaine , pramoxine, tetracaine Medications that relax muscles for surgery Opioid medications for pain Phenothiazines like chlorpromazine, mesoridazine, prochlorperazine, thioridazine This list may not describe all possible interactions. Give your health care provider a list of all the medicines, herbs, non-prescription drugs, or dietary supplements you use. Also tell them if you smoke, drink alcohol , or use illegal drugs. Some items may interact with your medicine. What should I watch for while using this medication? Visit your care team for regular checks on your progress. You may want to keep a record at home of how you feel your condition is responding to treatment. You may want to share this information with your care team at each visit. You should contact your care team if your seizures get worse or if you have any new types of seizures. Do not stop taking this medication or any of your seizure medications unless instructed by your care team. Stopping your medication suddenly can increase your seizures or their severity. This medication may cause serious skin reactions. They can happen weeks to  months after starting the medication. Contact your care team right away if you notice fevers or flu-like symptoms with a rash. The rash may be red or purple and then turn into blisters or peeling of the skin. Or, you might  notice a red rash with swelling of the face, lips or lymph nodes in your neck or under your arms. Wear a medical identification bracelet or chain if you are taking this medication for seizures. Carry a card that lists all your medications. This medication may affect your coordination, reaction time, or judgment. Do not drive or operate machinery until you know how this medication affects you. Sit up or stand slowly to reduce the risk of dizzy or fainting spells. Drinking alcohol  with this medication can increase the risk of these side effects. Your mouth may get dry. Chewing sugarless gum or sucking hard candy, and drinking plenty of water may help. Watch for new or worsening thoughts of suicide or depression. This includes sudden changes in mood, behaviors, or thoughts. These changes can happen at any time but are more common in the beginning of treatment or after a change in dose. Call your care team right away if you experience these thoughts or worsening depression. If you become pregnant while using this medication, you may enroll in the [place] American Antiepileptic Drug Pregnancy Registry by calling [phone]. This registry collects information about the safety of antiepileptic medication use during pregnancy. What side effects may I notice from receiving this medication? Side effects that you should report to your care team as soon as possible: Allergic reactions or angioedema--skin rash, itching, hives, swelling of the face, eyes, lips, tongue, arms, or legs, trouble swallowing or breathing Rash, fever, and swollen lymph nodes Thoughts of suicide or self harm, worsening mood, feelings of depression Trouble breathing Unusual changes in mood or behavior in children after use such as difficulty concentrating, hostility, or restlessness Side effects that usually do not require medical attention (report to your care team if they continue or are  bothersome): Dizziness Drowsiness Nausea Swelling of ankles, feet, or hands Vomiting This list may not describe all possible side effects. Call your doctor for medical advice about side effects. You may report side effects to FDA at 1-800-FDA-1088. Where should I keep my medication? Keep out of reach of children and pets. Store at room temperature between 15 and 30 degrees C (59 and 86 degrees F). Get rid of any unused medication after the expiration date. This medication may cause accidental overdose and death if taken by other adults, children, or pets. To get rid of medications that are no longer needed or have expired: Take the medication to a medication take-back program. Check with your pharmacy or law enforcement to find a location. If you cannot return the medication, check the label or package insert to see if the medication should be thrown out in the garbage or flushed down the toilet. If you are not sure, ask your care team. If it is safe to put it in the trash, empty the medication out of the container. Mix the medication with cat litter, dirt, coffee grounds, or other unwanted substance. Seal the mixture in a bag or container. Put it in the trash. NOTE: This sheet is a summary. It may not cover all possible information. If you have questions about this medicine, talk to your doctor, pharmacist, or health care provider.  2024 Elsevier/Gold Standard (2022-07-19 00:00:00)Diabetes Mellitus and Foot Care Diabetes, also called diabetes mellitus, may cause problems with  your feet and legs because of poor blood flow (circulation). Poor circulation may make your skin: Become thinner and drier. Break more easily. Heal more slowly. Peel and crack. You may also have nerve damage (neuropathy). This can cause decreased feeling in your legs and feet. This means that you may not notice minor injuries to your feet that could lead to more serious problems. Finding and treating problems early is the  best way to prevent future foot problems. How to care for your feet Foot hygiene  Wash your feet daily with warm water and mild soap. Do not use hot water. Then, pat your feet and the areas between your toes until they are fully dry. Do not soak your feet. This can dry your skin. Trim your toenails straight across. Do not dig under them or around the cuticle. File the edges of your nails with an emery board or nail file. Apply a moisturizing lotion or petroleum jelly to the skin on your feet and to dry, brittle toenails. Use lotion that does not contain alcohol  and is unscented. Do not apply lotion between your toes. Shoes and socks Wear clean socks or stockings every day. Make sure they are not too tight. Do not wear knee-high stockings. These may decrease blood flow to your legs. Wear shoes that fit well and have enough cushioning. Always look in your shoes before you put them on to be sure there are no objects inside. To break in new shoes, wear them for just a few hours a day. This prevents injuries on your feet. Wounds, scrapes, corns, and calluses  Check your feet daily for blisters, cuts, bruises, sores, and redness. If you cannot see the bottom of your feet, use a mirror or ask someone for help. Do not cut off corns or calluses or try to remove them with medicine. If you find a minor scrape, cut, or break in the skin on your feet, keep it and the skin around it clean and dry. You may clean these areas with mild soap and water. Do not clean the area with peroxide, alcohol , or iodine. If you have a wound, scrape, corn, or callus on your foot, look at it several times a day to make sure it is healing and not infected. Check for: Redness, swelling, or pain. Fluid or blood. Warmth. Pus or a bad smell. General tips Do not cross your legs. This may decrease blood flow to your feet. Do not use heating pads or hot water bottles on your feet. They may burn your skin. If you have lost feeling in  your feet or legs, you may not know this is happening until it is too late. Protect your feet from hot and cold by wearing shoes, such as at the beach or on hot pavement. Schedule a complete foot exam at least once a year or more often if you have foot problems. Report any cuts, sores, or bruises to your health care provider right away. Where to find more information American Diabetes Association: diabetes.org Association of Diabetes Care & Education Specialists: diabeteseducator.org Contact a health care provider if: You have a condition that increases your risk of infection, and you have any cuts, sores, or bruises on your feet. You have an injury that is not healing. You have redness on your legs or feet. You feel burning or tingling in your legs or feet. You have pain or cramps in your legs and feet. Your legs or feet are numb. Your feet always feel cold.  You have pain around any toenails. Get help right away if: You have a wound, scrape, corn, or callus on your foot and: You have signs of infection. You have a fever. You have a red line going up your leg. This information is not intended to replace advice given to you by your health care provider. Make sure you discuss any questions you have with your health care provider. Document Revised: 04/06/2022 Document Reviewed: 04/06/2022 Elsevier Patient Education  2024 [employer].

## 2023-10-30 NOTE — Progress Notes (Signed)
  Subjective:  Patient ID: Richard Silva, male    DOB: August 05, 1946,  MRN: [id number]  Chief Complaint  Patient presents with   [facility]    RM#13 [facility] Patient states he feels like is walking on sponges worried about neuropathy.     Discussed the use of AI scribe software for clinical note transcription with the patient, who gave verbal consent to proceed.  History of Present Illness         78 y.o. male with PMH of diabetic with neuropathy, has been experiencing a sensation akin to walking on sponges for approximately one to two years. The patient has not received any treatment for this sensation. The patient's neuropathy is somewhat managed with a nightly dose of 300mg  gabapentin , which provides enough relief to allow the patient to sleep. The patient's blood sugar levels have been high, with readings fluctuating between 350 and 198. The patient's most recent A1c reading was also high, prompting the patient's doctor to request daily blood sugar readings. The patient has advanced neuropathy but no wounds on his feet.     Objective:    Physical Exam         General: AAO x3, NAD  Dermatological: Nails are hypertrophic, dystrophic, brittle, discolored, elongated 10. No surrounding redness or drainage. Tenderness nails 1-5 bilaterally. No open lesions or pre-ulcerative lesions are identified today.  Vascular: Dorsalis Pedis artery and Posterior Tibial artery pedal pulses are 2/4 bilateral with immedate capillary fill time.  There is no pain with calf compression, swelling, warmth, erythema.   Neruologic: Sensation absent with Gustabo Speed monofilament.  Musculoskeletal: Bunion, hammertoes are present.  No other areas of discomfort today.   No images are attached to the encounter.    Results          Assessment:     Plan:  Patient was evaluated and treated and all questions answered.  Assessment and Plan          Diabetic Peripheral Neuropathy Chronic foot pain described  as walking on sponges for the past 1-2 years. Currently on Gabapentin  300mg  at night, which provides some relief and allows for sleep. No reported side effects. Previously on a higher dose for eczema, which was reduced when eczema was managed with Revoke. -Increase Gabapentin  to 300mg  TID. -Start with 300mg  BID for a few days, then increase to TID. -Check for drowsiness or other side effects. -Review in 3 months or sooner if side effects occur.  Diabetes Mellitus Recent high blood glucose readings (up to 350) and high A1C. Difficulty obtaining blood for glucose readings due to poor capillary refill. -Encouraged daily blood glucose monitoring. -Follow-up with primary care provider for further management of diabetes.  Foot Care, symptomatic onychomycosis No current wounds or ulcers. Patient self-manages toenail care. -Continue daily foot inspections at home. -Sharply debrided nails x 10 without any complications or bleeding.   Orthotics Expressed interest in shoe inserts. -Refer to Trish for measurement and fitting of insoles.     Return in about 3 months (around 01/28/2024) for nail trim, neuropathy .   Richard Silva DPM

## 2023-11-22 ENCOUNTER — Telehealth: Payer: Self-pay

## 2023-11-22 NOTE — Telephone Encounter (Signed)
 Left VM to schedule diabetic shoe pick up

## 2023-12-25 ENCOUNTER — Ambulatory Visit (INDEPENDENT_AMBULATORY_CARE_PROVIDER_SITE_OTHER): Payer: Medicare Other

## 2023-12-25 DIAGNOSIS — E1149 Type 2 diabetes mellitus with other diabetic neurological complication: Secondary | ICD-10-CM

## 2023-12-25 DIAGNOSIS — M21619 Bunion of unspecified foot: Secondary | ICD-10-CM

## 2023-12-25 DIAGNOSIS — M2142 Flat foot [pes planus] (acquired), left foot: Secondary | ICD-10-CM | POA: Diagnosis not present

## 2023-12-25 DIAGNOSIS — M2141 Flat foot [pes planus] (acquired), right foot: Secondary | ICD-10-CM

## 2023-12-25 DIAGNOSIS — M2041 Other hammer toe(s) (acquired), right foot: Secondary | ICD-10-CM | POA: Diagnosis not present

## 2023-12-25 DIAGNOSIS — M2042 Other hammer toe(s) (acquired), left foot: Secondary | ICD-10-CM

## 2023-12-25 NOTE — Progress Notes (Signed)
 Patient presents today to pick up diabetic shoes and insoles.  Patient was dispensed 1 pair of diabetic shoes and 3 pairs of foam casted diabetic insoles. We stretched shoes in area of BIL bunion deformity to reduce pressure I also ground FF on 1st pr of inserts to make thinner making even more room in FF area of boots   Fit was satisfactory. Instructions for break-in and wear was reviewed and a copy was given to the patient.   Re-appointment for regularly scheduled diabetic foot care visits or if they should experience any trouble with the shoes or insoles.

## 2024-01-29 ENCOUNTER — Encounter: Payer: Self-pay | Admitting: Podiatry

## 2024-01-29 ENCOUNTER — Ambulatory Visit (INDEPENDENT_AMBULATORY_CARE_PROVIDER_SITE_OTHER): Payer: Medicare Other | Admitting: Podiatry

## 2024-01-29 DIAGNOSIS — B351 Tinea unguium: Secondary | ICD-10-CM | POA: Diagnosis not present

## 2024-01-29 DIAGNOSIS — E1149 Type 2 diabetes mellitus with other diabetic neurological complication: Secondary | ICD-10-CM | POA: Diagnosis not present

## 2024-01-29 DIAGNOSIS — M79675 Pain in left toe(s): Secondary | ICD-10-CM

## 2024-01-29 DIAGNOSIS — M79674 Pain in right toe(s): Secondary | ICD-10-CM | POA: Diagnosis not present

## 2024-01-31 NOTE — Progress Notes (Signed)
  Subjective:  Patient ID: Richard Silva, male    DOB: 19-Jan-1946,  MRN: [id number]  Chief Complaint  Patient presents with   [facility]    RM#13 [facility] no concerns today.   History of Present Illness         78 y.o. male with PMH of diabetic with neuropathy, presents the office the above concerns.  His nails are thick and discolored he cannot do them himself and cause discomfort.  He is on gabapentin for neuropathy.  No open lesions.     Objective:    Physical Exam         General: AAO x3, NAD  Dermatological: Nails are hypertrophic, dystrophic, brittle, discolored, elongated 10. No surrounding redness or drainage. Tenderness nails 1-5 bilaterally. No open lesions or pre-ulcerative lesions are identified today.  Vascular: Dorsalis Pedis artery and Posterior Tibial artery pedal pulses are 2/4 bilateral with immedate capillary fill time.  There is no pain with calf compression, swelling, warmth, erythema.   Neruologic: Sensation absent with Charolett Copes monofilament.  Musculoskeletal: Bunion, hammertoes are present.  No other areas of discomfort today.        Assessment:   Symptomatic onychomycosis, neuropathy  Plan:  Patient was evaluated and treated and all questions answered.  Assessment and Plan    Symptomatic onychomycosis - Sharp debrided nails x 10 without any complications or bleeding.  Diabetic Peripheral Neuropathy. - Continue gabapentin to 300mg  TID. -Start with 300mg  TID, no side affects at this time however continue to monitor.  Return in about 3 months (around 04/29/2024) for diabetic foot care.  Charity Conch DPM

## 2024-05-02 ENCOUNTER — Ambulatory Visit (INDEPENDENT_AMBULATORY_CARE_PROVIDER_SITE_OTHER): Admitting: Podiatry

## 2024-05-02 VITALS — Ht 66.0 in | Wt 211.0 lb

## 2024-05-02 DIAGNOSIS — M79675 Pain in left toe(s): Secondary | ICD-10-CM | POA: Diagnosis not present

## 2024-05-02 DIAGNOSIS — E1149 Type 2 diabetes mellitus with other diabetic neurological complication: Secondary | ICD-10-CM

## 2024-05-02 DIAGNOSIS — M79674 Pain in right toe(s): Secondary | ICD-10-CM | POA: Diagnosis not present

## 2024-05-02 DIAGNOSIS — B353 Tinea pedis: Secondary | ICD-10-CM | POA: Diagnosis not present

## 2024-05-02 DIAGNOSIS — B351 Tinea unguium: Secondary | ICD-10-CM

## 2024-05-02 MED ORDER — GABAPENTIN 300 MG PO CAPS: 300.0000 mg | ORAL_CAPSULE | Freq: Three times a day (TID) | ORAL | 3 refills | Status: DC

## 2024-05-02 MED ORDER — CLOTRIMAZOLE-BETAMETHASONE 1-0.05 % EX CREA: 1.0000 | TOPICAL_CREAM | Freq: Every day | CUTANEOUS | 2 refills | Status: AC

## 2024-05-02 NOTE — Progress Notes (Unsigned)
Peeling skin

## 2024-05-04 LAB — BASIC METABOLIC PANEL WITH GFR: EGFR: 42

## 2024-05-04 LAB — HEMOGLOBIN A1C: A1c: 6.6

## 2024-05-04 LAB — TSH: TSH: 2.64

## 2024-05-06 ENCOUNTER — Ambulatory Visit: Admitting: Podiatry

## 2024-08-02 ENCOUNTER — Ambulatory Visit: Admitting: Podiatry

## 2024-08-02 VITALS — Ht 66.0 in | Wt 211.0 lb

## 2024-08-02 DIAGNOSIS — E1149 Type 2 diabetes mellitus with other diabetic neurological complication: Secondary | ICD-10-CM | POA: Diagnosis not present

## 2024-08-02 DIAGNOSIS — B351 Tinea unguium: Secondary | ICD-10-CM

## 2024-08-02 DIAGNOSIS — M79675 Pain in left toe(s): Secondary | ICD-10-CM | POA: Diagnosis not present

## 2024-08-02 DIAGNOSIS — M79674 Pain in right toe(s): Secondary | ICD-10-CM

## 2024-08-02 NOTE — Progress Notes (Signed)
  Subjective:  Patient ID: Richard Silva, male    DOB: 12/15/1945,  MRN: [id number]  Chief Complaint  Patient presents with   Diabetes    Rm 13 DF.  Pt concerned about neuropathy of the feet.   78 year old male presents the office with above concerns.  States the nails are thick and elongated cannot do himself and cause discomfort.   He also feels that the gabapentin  is not helping as much of the neuropathy.  He stated it helped some but he continues to have symptoms.    No open lesions.  No other concerns.   Objective:    Physical Exam         General: AAO x3, NAD  Dermatological: Nails are hypertrophic, dystrophic, brittle, discolored, elongated 10. No surrounding redness or drainage. Tenderness nails 1-5 bilaterally. No open lesions or pre-ulcerative lesions are identified today.  Lesions noted.  Vascular: Dorsalis Pedis artery and Posterior Tibial artery pedal pulses are 2/4 bilateral with immedate capillary fill time.  There is no pain with calf compression, swelling, warmth, erythema.   Neruologic: Sensation absent with Gustabo Speed monofilament.  Musculoskeletal: Bunion, hammertoes are present.  No other areas of discomfort today.        Assessment:   Symptomatic onychomycosis, neuropathy  Plan:  Patient was evaluated and treated and all questions answered.  Assessment and Plan    Symptomatic onychomycosis - Sharp debrided nails x 10 without any complications or bleeding.  Diabetic Peripheral Neuropathy. - Continue gabapentin  to 300mg  TID for now. Get updated blood work form PCP. -Discussed [employer]-will attempt authorization  Return in about 3 months (around 11/02/2024) for nail trim, neuropathy.  Richard Silva DPM

## 2024-11-04 ENCOUNTER — Ambulatory Visit: Admitting: Podiatry

## 2024-11-04 DIAGNOSIS — M79675 Pain in left toe(s): Secondary | ICD-10-CM | POA: Diagnosis not present

## 2024-11-04 DIAGNOSIS — M79674 Pain in right toe(s): Secondary | ICD-10-CM

## 2024-11-04 DIAGNOSIS — B351 Tinea unguium: Secondary | ICD-10-CM

## 2024-11-04 DIAGNOSIS — E1149 Type 2 diabetes mellitus with other diabetic neurological complication: Secondary | ICD-10-CM

## 2024-11-04 MED ORDER — GABAPENTIN 300 MG PO CAPS: 300.0000 mg | ORAL_CAPSULE | Freq: Every day | ORAL | 2 refills | Status: AC

## 2024-11-04 NOTE — Patient Instructions (Signed)
 Gabapentin  Capsules or Tablets What is this medication? GABAPENTIN  (GAB a PEN tin) treats nerve pain. It may also be used to prevent and control seizures in people with epilepsy. It works by calming overactive nerves in your body. This medicine may be used for other purposes; ask your health care provider or pharmacist if you have questions. COMMON BRAND NAME(S): Active-PAC with Gabapentin , Gabarone , Gralise , Neurontin  What should I tell my care team before I take this medication? They need to know if you have any of these conditions: Have or have had depression Kidney disease Lung or breathing disease, such as asthma or COPD Substance use disorder Suicidal thoughts, plans, or attempt An unusual or allergic reaction to gabapentin , other medications, foods, dyes, or preservatives Pregnant or trying to get pregnant Breastfeeding How should I use this medication? Take this medication by mouth with a glass of water. Follow the directions on the prescription label. You can take it with or without food. If it upsets your stomach, take it with food. Take your medication at regular intervals. Do not take it more often than directed. Do not stop taking except on your care team's advice. If you are directed to break the 600 or 800 mg tablets in half as part of your dose, the extra half tablet should be used for the next dose. If you have not used the extra half tablet within 28 days, it should be thrown away. A special MedGuide will be given to you by the pharmacist with each prescription and refill. Be sure to read this information carefully each time. Talk to your care team about the use of this medication in children. While this medication may be prescribed for children as young as 3 years for selected conditions, precautions do apply. Overdosage: If you think you have taken too much of this medicine contact a poison control center or emergency room at once. NOTE: This medicine is only for you. Do not  share this medicine with others. What if I miss a dose? If you miss a dose, take it as soon as you can. If it is almost time for your next dose, take only that dose. Do not take double or extra doses. What may interact with this medication? Alcohol Antihistamines for allergy , cough, and cold Certain medications for anxiety or sleep Certain medications for depression like amitriptyline, fluoxetine, sertraline Certain medications for seizures like phenobarbital, primidone Certain medications for stomach problems General anesthetics like halothane, isoflurane, methoxyflurane, propofol  Local anesthetics like lidocaine , pramoxine, tetracaine Medications that relax muscles for surgery Opioid medications for pain Phenothiazines like chlorpromazine, mesoridazine, prochlorperazine, thioridazine This list may not describe all possible interactions. Give your health care provider a list of all the medicines, herbs, non-prescription drugs, or dietary supplements you use. Also tell them if you smoke, drink alcohol, or use illegal drugs. Some items may interact with your medicine. What should I watch for while using this medication? Visit your care team for regular checks on your progress. Tell your care team if your symptoms do not start to get better or if they get worse. Do not suddenly stop taking this medication. You may develop a severe reaction. Your care team will tell you how much medication to take. If your care team wants you to stop the medication, the dose may be slowly lowered over time to avoid any side effects. This medication may affect your coordination, reaction time, or judgment. Do not drive or operate machinery until you know how this medication affects you. Sit  up or stand slowly to reduce the risk of dizzy or fainting spells. Drinking alcohol with this medication can increase the risk of these side effects. Taking this medication with other CNS depressants can make you too sleepy. This  can make it hard to breathe and stay awake. In some cases, it can cause coma and death. CNS depressants include opioids, benzodiazepines, muscle relaxants, medications for sleep, and alcohol. Talk to your care team about all the medications, vitamins, and supplements you take. They can tell you what is safe to take together. Call emergency services right away if you have slow or shallow breathing, feel dizzy or confused, or have trouble staying awake. This medication can cause new or worsening depression and increase the risk of suicidal thoughts and actions in a small number of people. This can happen while you are taking this medication or after stopping it. Talk to your care team right away if you have changes in mood or behavior or thoughts of self-harm or suicide. They can help you. This medication may cause serious skin reactions. They can happen weeks to months after starting the medication. Talk to your care team right away if you have fevers or flu-like symptoms with a rash. The rash may be red or purple and then turn into blisters or peeling of the skin. Or you might notice a red rash with swelling of the face, lips, or lymph nodes in your neck or under your arms. If you take this medication for seizures, wear a medical ID bracelet or chain. Carry a card that describes your condition. List the medications and doses you take on the card. Talk to your care team if you may be pregnant. There are benefits and risks to taking medications during pregnancy. Your care team can help you find the option that works for you. What side effects may I notice from receiving this medication? Side effects that you should report to your care team as soon as possible: Allergic reactions or angioedema--skin rash, itching or hives, swelling of the face, eyes, lips, tongue, arms, or legs, trouble swallowing or breathing Rash, fever, and swollen lymph nodes Thoughts of suicide or self-harm, worsening mood, feelings of  depression Trouble breathing Unusual changes in mood or behavior in children after use such as trouble concentrating, hostility, or restlessness Side effects that usually do not require medical attention (report these to your care team if they continue or are bothersome): Dizziness Drowsiness Nausea Swelling of the ankles, hands, or feet Vomiting This list may not describe all possible side effects. Call your doctor for medical advice about side effects. You may report side effects to FDA at 1-800-FDA-1088. Where should I keep my medication? Keep out of reach of children and pets. Store at room temperature between 15 and 30 degrees C (59 and 86 degrees F). Get rid of any unused medication after the expiration date. This medication may cause accidental overdose and death if taken by other adults, children, or pets. To get rid of medications that are no longer needed or have expired: Take the medication to a medication take-back program. Check with your pharmacy or law enforcement to find a location. If you cannot return the medication, check the label or package insert to see if the medication should be thrown out in the garbage or flushed down the toilet. If you are not sure, ask your care team. If it is safe to put it in the trash, empty the medication out of the container. Mix the medication  with cat litter, dirt, coffee grounds, or other unwanted substance. Seal the mixture in a bag or container. Put it in the trash. NOTE: This sheet is a summary. It may not cover all possible information. If you have questions about this medicine, talk to your doctor, pharmacist, or health care provider.  2025 Elsevier/Gold Standard (2024-02-19 00:00:00)

## 2024-11-05 ENCOUNTER — Ambulatory Visit: Admitting: Podiatry

## 2024-11-05 NOTE — Progress Notes (Signed)
"  °  Subjective:  Patient ID: Richard Silva, male    DOB: [birth year],  MRN: [id number] Chief Complaint  Patient presents with   [facility]    NIDDM Patient with an A1c of 6.6 presents today for [facility] and nail trim reports neuropathy symptoms remain the same.     80 year old male presents the office with above concerns.  States the nails are thick and elongated cannot do himself and cause discomfort.   Neuropathy has been stable.  He is no longer on gabapentin .  He was taking this 3 times a day.   Objective:    Physical Exam         General: AAO x3, NAD  Dermatological: Nails are hypertrophic, dystrophic, brittle, discolored, elongated 10. No surrounding redness or drainage. Tenderness nails 1-5 bilaterally. No open lesions or pre-ulcerative lesions are identified today.  Lesions noted.  Vascular: Dorsalis Pedis artery and Posterior Tibial artery pedal pulses are 2/4 bilateral with immedate capillary fill time.  There is no pain with calf compression, swelling, warmth, erythema.   Neruologic: Sensation absent with Gustabo Speed monofilament.  Musculoskeletal: Bunion, hammertoes are present.  No other areas of discomfort today.        Assessment:   Symptomatic onychomycosis, neuropathy  Plan:  Patient was evaluated and treated and all questions answered.  Assessment and Plan    Symptomatic onychomycosis - Sharp debrided nails x 10 without any complications or bleeding.  Diabetic Peripheral Neuropathy. - Given kidney function will only do gabapentin  once daily and this was refilled today.  He was taking this as needed  No follow-ups on file.   Richard Silva DPM "

## 2024-11-08 ENCOUNTER — Ambulatory Visit: Admitting: Podiatrist

## 2024-11-08 DIAGNOSIS — M2142 Flat foot [pes planus] (acquired), left foot: Secondary | ICD-10-CM

## 2024-11-08 DIAGNOSIS — E0843 Diabetes mellitus due to underlying condition with diabetic autonomic (poly)neuropathy: Secondary | ICD-10-CM

## 2024-11-08 DIAGNOSIS — M21612 Bunion of left foot: Secondary | ICD-10-CM

## 2024-11-08 DIAGNOSIS — M21611 Bunion of right foot: Secondary | ICD-10-CM

## 2024-11-08 DIAGNOSIS — M2141 Flat foot [pes planus] (acquired), right foot: Secondary | ICD-10-CM

## 2024-11-08 NOTE — Progress Notes (Signed)
 DIABETIC SHOES CASTING:  Dr.  Luetta is his primary care physician   Concerns:  patient has history of diabetes with neuropathy.  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  Slight hammertoe contracture also present bilateral.   Patient presented for foam casting for 3 pr custom diabetic shoe inserts-  Patient is measured with a [place] device to be a size 10   Diabetic shoes are chosen from the [place].  The shoes chosen are [employer] in [place]   I gave the patient the paperwork to be signed and will fax it in myself as well.  I explained we must have this paperwork signed before we can order the shoes and inserts.  He has diabetic shoes from last year and they are also the [employer] in [place] with the custom diabetic inserts x 3 pr-    The patient will be contacted when the shoes and insert are ready to be picked up.

## 2024-11-12 ENCOUNTER — Telehealth: Payer: Self-pay

## 2024-11-12 ENCOUNTER — Encounter: Payer: Self-pay | Admitting: Surgery

## 2024-11-12 NOTE — Telephone Encounter (Signed)
 Dr Wilhelmenia your schedule is full for 2/13.  Please advise

## 2024-11-12 NOTE — Telephone Encounter (Signed)
-----   Message from Aloha Finner, MD sent at 11/12/2024 12:07 PM EST ----- Regarding: RE: Follow-up CW, Happy to be of assistance. If he is available we can get him in the next couple of weeks I can get him in on 2/13 so I will have my team reach out with him to see if that is possible.  Niasia Lanphear, This patient needs updated colonoscopy for colon cancer surveillance and new onset of anemia.  We need to try to get the [facility] records from hematology if possible to see what his blood counts are doing. Patient needs to be a colonoscopy plus endoscopy.  Go ahead and just add him for 2/13 and I will plan to start the day 730 to make sure things are moving appropriately. Thanks. GM ----- Message ----- From: Teresa Lonni HERO, MD Sent: 11/12/2024  10:04 AM EST To: Aloha Finner Raddle., MD Subject: Follow-up                                      Walterine Bass --  This is a  mutual patient. He is due for surveillance scope 04/2025 for colon cancer however has developed anemia. Would you be able to get him in soon to go ahead and update? I'm sending him for CT CAP ([place]).  Thanks,  [place]

## 2024-11-13 ENCOUNTER — Other Ambulatory Visit (HOSPITAL_COMMUNITY): Payer: Self-pay | Admitting: Surgery

## 2024-11-13 DIAGNOSIS — C182 Malignant neoplasm of ascending colon: Secondary | ICD-10-CM

## 2024-11-13 NOTE — Telephone Encounter (Signed)
 Previsit has been set up for 12/10/24 at 1230 pm  Endo colon set up for 12/18/24 at 2 pm in the [facility]

## 2024-11-13 NOTE — Telephone Encounter (Signed)
 Schedule for 3/4. Thanks. GM

## 2024-11-13 NOTE — Telephone Encounter (Signed)
 Previsit and Endo colon  scheduled, pt instructed and medications reviewed.  Patient instructions mailed to home.  Patient to call with any questions or concerns.  The records will be faxed to Dr Wilhelmenia attention as soon as able

## 2024-11-14 ENCOUNTER — Ambulatory Visit (HOSPITAL_COMMUNITY)
Admission: RE | Admit: 2024-11-14 | Discharge: 2024-11-14 | Disposition: A | Discharge status: Home / Self Care | Source: Ambulatory Visit | Attending: Surgery | Admitting: Surgery

## 2024-11-14 ENCOUNTER — Other Ambulatory Visit (HOSPITAL_COMMUNITY): Payer: Self-pay | Admitting: Surgery

## 2024-11-14 DIAGNOSIS — C182 Malignant neoplasm of ascending colon: Secondary | ICD-10-CM

## 2024-11-14 DIAGNOSIS — N289 Disorder of kidney and ureter, unspecified: Secondary | ICD-10-CM

## 2024-11-14 LAB — POCT I-STAT CREATININE: Creatinine, Ser: 1.9 mg/dL — ABNORMAL HIGH (ref 0.61–1.24)

## 2024-11-14 MED ORDER — IOHEXOL 300 MG/ML  SOLN
100.0000 mL | Freq: Once | INTRAMUSCULAR | Status: AC | PRN
  Administered 2024-11-14: 80 mL via INTRAVENOUS

## 2024-11-14 MED ORDER — IOHEXOL 9 MG/ML PO SOLN
ORAL | Status: AC
  Filled 2024-11-14: qty 1000

## 2024-11-14 MED ORDER — IOHEXOL 9 MG/ML PO SOLN
500.0000 mL | ORAL | Status: AC
  Administered 2024-11-14: 500 mL via ORAL

## 2024-11-15 ENCOUNTER — Other Ambulatory Visit (HOSPITAL_COMMUNITY): Payer: Self-pay | Admitting: Surgery

## 2024-11-15 ENCOUNTER — Ambulatory Visit (HOSPITAL_COMMUNITY)
Admission: RE | Admit: 2024-11-15 | Discharge: 2024-11-15 | Disposition: A | Source: Ambulatory Visit | Attending: Surgery

## 2024-11-15 ENCOUNTER — Ambulatory Visit: Payer: Self-pay | Admitting: Surgery

## 2024-11-15 DIAGNOSIS — C182 Malignant neoplasm of ascending colon: Secondary | ICD-10-CM | POA: Insufficient documentation

## 2024-11-15 DIAGNOSIS — N289 Disorder of kidney and ureter, unspecified: Secondary | ICD-10-CM | POA: Insufficient documentation

## 2024-11-15 MED ORDER — GADOBUTROL 1 MMOL/ML IV SOLN
10.0000 mL | Freq: Once | INTRAVENOUS | Status: AC | PRN
  Administered 2024-11-15: 10 mL via INTRAVENOUS

## 2024-12-10 ENCOUNTER — Encounter

## 2024-12-18 ENCOUNTER — Encounter: Admitting: Gastroenterology

## 2025-02-03 ENCOUNTER — Ambulatory Visit: Admitting: Podiatry
